# Patient Record
Sex: Male | Born: 1990 | Race: Black or African American | Hispanic: No | Marital: Married | Smoking: Current every day smoker
Health system: Southern US, Community
[De-identification: ages and names within clinical notes are randomized; demographics above are authoritative.]

## PROBLEM LIST (undated history)

## (undated) DIAGNOSIS — J45909 Unspecified asthma, uncomplicated: Secondary | ICD-10-CM

---
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
## 2014-02-28 ENCOUNTER — Emergency Department (HOSPITAL_COMMUNITY)
Admission: EM | Admit: 2014-02-28 | Discharge: 2014-02-28 | Disposition: A | Discharge status: Home / Self Care | Payer: Self-pay | Attending: Emergency Medicine | Admitting: Emergency Medicine

## 2014-02-28 ENCOUNTER — Encounter (HOSPITAL_COMMUNITY): Payer: Self-pay | Admitting: Emergency Medicine

## 2014-02-28 DIAGNOSIS — Z23 Encounter for immunization: Secondary | ICD-10-CM | POA: Insufficient documentation

## 2014-02-28 DIAGNOSIS — W268XXA Contact with other sharp object(s), not elsewhere classified, initial encounter: Secondary | ICD-10-CM | POA: Insufficient documentation

## 2014-02-28 DIAGNOSIS — S81009A Unspecified open wound, unspecified knee, initial encounter: Secondary | ICD-10-CM | POA: Insufficient documentation

## 2014-02-28 DIAGNOSIS — Y9389 Activity, other specified: Secondary | ICD-10-CM | POA: Insufficient documentation

## 2014-02-28 DIAGNOSIS — Y929 Unspecified place or not applicable: Secondary | ICD-10-CM | POA: Insufficient documentation

## 2014-02-28 DIAGNOSIS — F17200 Nicotine dependence, unspecified, uncomplicated: Secondary | ICD-10-CM | POA: Insufficient documentation

## 2014-02-28 DIAGNOSIS — S81809A Unspecified open wound, unspecified lower leg, initial encounter: Principal | ICD-10-CM

## 2014-02-28 DIAGNOSIS — S91009A Unspecified open wound, unspecified ankle, initial encounter: Principal | ICD-10-CM

## 2014-02-28 DIAGNOSIS — S81812A Laceration without foreign body, left lower leg, initial encounter: Secondary | ICD-10-CM

## 2014-02-28 MED ORDER — TETANUS-DIPHTH-ACELL PERTUSSIS 5-2.5-18.5 LF-MCG/0.5 IM SUSP
0.5000 mL | Freq: Once | INTRAMUSCULAR | Status: AC
  Administered 2014-02-28: 0.5 mL via INTRAMUSCULAR
  Filled 2014-02-28: qty 0.5

## 2014-02-28 MED ORDER — IBUPROFEN 800 MG PO TABS: 800.0000 mg | ORAL_TABLET | Freq: Three times a day (TID) | ORAL | Status: DC

## 2014-02-28 NOTE — ED Notes (Signed)
Pt reports laceration to left lower leg on glass this am, sts cleaned wound with alcohol and neosporin, bleeding controlled at this time, cut is straight clean cut. Approx. 2 inches

## 2014-02-28 NOTE — ED Provider Notes (Signed)
CSN: [id number]     Arrival date & time 02/28/14  [id number] History   First MD Initiated Contact with Patient 02/28/14 [phone]     Chief Complaint  Patient presents with  . Laceration     (Consider location/radiation/quality/duration/timing/severity/associated sxs/prior Treatment) The history is provided by the patient. No language interpreter was used.   David Spears is a 23 y.o. male [facility] with the complaint of LLE laceration .  The accident happened this morning.  The patient was taking out the trashing with some shards of glass. The bag slipped and the glass cut the medial aspect of the patient's left lower extremity.  The patient then cleaned the wound with rubbing alcoholol.   He denies any loss of function or sensation distal to the wound.  The patient does not remember the last time he had a tetanus shot.  History reviewed. No pertinent past medical history. History reviewed. No pertinent past surgical history. History reviewed. No pertinent family history. History  Substance Use Topics  . Smoking status: Current Every Day Smoker  . Smokeless tobacco: Not on file  . Alcohol Use: No    Review of Systems  Constitutional: Negative for fever and chills.  HENT: Negative.   Respiratory: Negative.   Cardiovascular: Negative.   Gastrointestinal: Negative.   Musculoskeletal: Negative.   Skin: Positive for wound (LLE).  Neurological: Negative.       Allergies  Review of patient's allergies indicates no known allergies.  Home Medications   Prior to Admission medications   Not on File   BP 136/76  Pulse 63  Temp(Src) 97.7 F (36.5 C) (Oral)  Resp 19  Ht 6\' 3"  (1.905 m)  Wt 190 lb (86.183 kg)  BMI 23.75 kg/m2  SpO2 99% Physical Exam  Constitutional: He appears well-developed and well-nourished.  HENT:  Head: Normocephalic.  Neck: Normal range of motion. Neck supple.  Cardiovascular: Normal rate and regular rhythm.   Pulmonary/Chest: Effort normal and  breath sounds normal.  Abdominal: Soft. Bowel sounds are normal. There is no tenderness. There is no rebound and no guarding.  Musculoskeletal: Normal range of motion.  Neurological: He is alert. No cranial nerve deficit.  Skin: Skin is warm and dry. Laceration (LLE 3-4cm Superficial) noted. No rash noted.     Psychiatric: He has a normal mood and affect.    ED Course  Procedures (including critical care time) Labs Review Labs Reviewed - No data to display LACERATION REPAIR Performed by: Elpidio AnisUPSTILL, Tranesha Lessner A Authorized by: Elpidio AnisUPSTILL, Tayquan Gassman A Consent: Verbal consent obtained. Risks and benefits: risks, benefits and alternatives were discussed Consent given by: patient Patient identity confirmed: provided demographic data Prepped and Draped in normal sterile fashion Wound explored  Laceration Location: left lower extremity  Laceration Length: 4 cm  No Foreign Bodies seen or palpated  Anesthesia: local infiltration  Local anesthetic: lidocaine 1% w/ epinephrine  Anesthetic total: 2 ml  Irrigation method: syringe Amount of cleaning: standard  Skin closure: staple  Number of sutures: 5  Technique: staple  Patient tolerance: Patient tolerated the procedure well with no immediate complications.  Imaging Review No results found.   EKG Interpretation None      MDM   Final diagnoses:  None   1. Left LE laceration  Uncomplicated laceration, repaired as per above. Stable for discharge.   LACERATION REPAIR Performed by: Elpidio AnisUPSTILL, Destynee Stringfellow A Authorized by: Elpidio AnisUPSTILL, Mekala Winger A Consent: Verbal consent obtained. Risks and benefits: risks, benefits and alternatives were discussed  Consent given by: patient Patient identity confirmed: provided demographic data Prepped and Draped in normal sterile fashion Wound explored  Laceration Location: LLE  Laceration Length: 3.6cm  No Foreign Bodies seen or palpated  Anesthesia: local infiltration  Local anesthetic: lidocaine 2%  with epinephrine  Anesthetic total: 5 ml  Irrigation method: syringe Amount of cleaning: standard  Skin closure with good approximation.   Number of staples: 8  Patient tolerance: Patient tolerated the procedure well with no immediate complications.  Arnoldo HookerShari A Abednego Yeates, PA-C 03/06/14 [phone]

## 2014-02-28 NOTE — Discharge Instructions (Signed)
Tetanus and Diphtheria Vaccine Your caregiver has suggested that you receive an immunization to prevent tetanus (lockjaw) and diphtheria. Tetanus and diphtheria are serious and deadly infectious diseases of the past that have been nearly wiped out by modern immunizations. Td or DT vaccines (shots) are the immunizations given to help prevent these illnesses. Td is the medical term for a standard tetanus dose, small diphtheria dose. DT means both in standard doses. ABOUT THE DISEASES Tetanus (lockjaw) and diphtheria are serious diseases. Tetanus is caused by a germ that lives in the soil. It enters the body through a cut or wound, often caused by a nail or broken piece of glass. You cannot catch tetanus from another person. Diphtheria spreads when germs pass from an infected person to the nose or throat of others. Tetanus causes serious, painful spasms of all muscles. It can lead to:  "Locking" of the muscles of the jaw and throat, so the patient cannot open his or her mouth or swallow.  Damage to the heart muscle. Diphtheria causes a thick coating in the nose, throat, or airway. It can lead to:  Breathing problems.  Kidney problems.  Heart failure.  Paralysis.  Death. ABOUT THE VACCINES  A vaccine is a shot (immunization) that can help prevent a disease. Vaccines have helped lower the rates of getting certain diseases. If people stopped getting vaccinated, more people would develop illnesses. These vaccines can be used in three ways:  As catch-up for people who did not get all their doses when they were children.  As a booster dose every 10 years.  For protection against tetanus infection, after a wound. Benefits of the vaccines Vaccination is the best way to protect against tetanus and diphtheria. Because of vaccination, there are fewer cases of these diseases. Cases are rare in children because most get a routine vaccination with DTP (Diphtheria, Tetanus, and Pertussis), DTaP  (Diphtheria, Tetanus, and acellular Pertussis), or DT (Diphtheria and Tetanus) vaccines. There would be many more cases if we stopped vaccinating people. Tetanus kills about 1 in 5 people who are infected. WHEN SHOULD YOU GET TD VACCINE?  Td is made for people 62 years of age and older.  People who have not gotten at least 3 doses of any tetanus and diphtheria vaccine (DTP, DTaP or DT) during their lifetime should do so using Td. After a person gets the third dose, a Td dose is needed every 10 years all through life. This is because protection fades over time. Booster shots are needed every 10 years.  Other vaccines may be given at the same time as Td. You may not know today whether your immunizations are current. The vaccine given today is to protect you from your next cut or injury. It does not offer protection for the current injury. An immune globulin injection may be given, if protection is needed immediately. Check with your caregiver later regarding your immunization status. Tell your caregiver if the person getting the vaccine:  Has ever had a serious allergic reaction or other problem with Td, or any other tetanus and diphtheria vaccine (DTP, DTaP, or DT). People who have had a serious allergic reaction should not receive the vaccine.  Has epilepsy or another nervous system illness.  Has had Guillain Barre Syndrome (GBS) in the past.  Now has a moderate or severe illness.  Is pregnant.  If you are not sure, ask your caregiver. WHAT ARE THE RISKS FROM TD VACCINE?  As with any medicine, there are very small  risks that serious problems, even death, could occur after getting a vaccine. However, the risk of a serious side effect from the vaccine is almost zero.  The risks from the vaccine are much smaller than the risks from the diseases, if people stopped getting vaccinated. Both diseases can cause serious health problems, which are prevented by the vaccine.  Almost all people who get  Td have no problems from it. Mild problems If mild problems occur, they usually start within hours to a day or two after vaccination. They may last 1-2 days:  Soreness, redness, or swelling where the shot was given.  Headache or tiredness.  Occasionally, a low grade fever. These problems can be worse in adults who get Td vaccine very often. Non-aspirin medicines may be used to reduce soreness. Severe problems These problems happen very rarely:  Serious allergic reaction (at most, occurs in 1 in 1 million vaccinated persons). This occurs almost immediately, and is treatable with medicines. Signs of a serious allergic reaction include:  Difficulty breathing.  Hoarseness or wheezing.  Hives.  Dizziness.  Deep, aching pain and muscle wasting in upper arm(s). Overall, the benefits to you and your family from these vaccines are far greater than the risk. WHAT TO DO IF THERE IS A SERIOUS REACTION:  Call a caregiver or get the person to a doctor or emergency room right away.  Write down what happened, the date and time it happened, and tell your caregiver.  Ask your caregiver to file a Vaccine Adverse Event Report form or call, toll-free: [phone] If you want to learn more about this vaccine, ask your caregiver. She/he can give you the vaccine package insert or suggest other sources of information. Also, the [employer] Injury Compensation Program gives compensation (payment) for persons thought to be injured by vaccines. For details call, toll-free: [phone](800) [phone]. Document Released: 08/08/2000 Document Revised: 11/03/2011 Document Reviewed: 06/28/2009 [facility] Patient Information 2015 [place], [place]. This information is not intended to replace advice given to you by your health care provider. Make sure you discuss any questions you have with your health care provider. Staple Wound Closure Staples are used to help a wound heal faster by holding the edges of the wound  together. HOME CARE  Keep the area around the staples clean and dry.  Rest and raise (elevate) the injured part above the level of your heart.  See your doctor for a follow-up check of the wound.  See your doctor to have the staples removed.  Clean the wound daily with water.  Do not soak the wound in water for long periods of time.  Let air reach the wound as it heals. GET HELP RIGHT AWAY IF:   You have redness or puffiness around the wound.  You have a red line going away from the wound.  You have more pain or tenderness.  You have yellowish-white fluid (pus) coming from the wound.  Your wound does not stay together after the staples have been taken out.  You see something coming out of the wound, such as wood or glass.  You have problems moving the injured area.  You have a fever or lasting symptoms for more than 2-3 days.  You have a fever and your symptoms suddenly get worse. MAKE SURE YOU:   Understand these instructions.  Will watch this condition.  Will get help right away if you are not doing well or get worse. Document Released: 05/20/2008 Document Revised: 05/05/2012 Document Reviewed: 02/22/2012 ExitCare Patient  Information 2015 [place], [place]. This information is not intended to replace advice given to you by your health care provider. Make sure you discuss any questions you have with your health care provider.

## 2014-03-06 NOTE — ED Provider Notes (Signed)
Medical screening examination/treatment/procedure(s) were performed by non-physician practitioner and as supervising physician I was immediately available for consultation/collaboration.     Geoffery Lyonsouglas Zlata Alcaide, MD 03/06/14 [id number]

## 2014-03-11 ENCOUNTER — Encounter (HOSPITAL_COMMUNITY): Payer: Self-pay | Admitting: Emergency Medicine

## 2014-03-11 ENCOUNTER — Emergency Department (INDEPENDENT_AMBULATORY_CARE_PROVIDER_SITE_OTHER)
Admission: EM | Admit: 2014-03-11 | Discharge: 2014-03-11 | Disposition: A | Discharge status: Home / Self Care | Payer: Self-pay | Source: Home / Self Care | Attending: Emergency Medicine | Admitting: Emergency Medicine

## 2014-03-11 DIAGNOSIS — L03119 Cellulitis of unspecified part of limb: Secondary | ICD-10-CM

## 2014-03-11 DIAGNOSIS — L03116 Cellulitis of left lower limb: Secondary | ICD-10-CM

## 2014-03-11 DIAGNOSIS — L02419 Cutaneous abscess of limb, unspecified: Secondary | ICD-10-CM

## 2014-03-11 DIAGNOSIS — Z4802 Encounter for removal of sutures: Secondary | ICD-10-CM

## 2014-03-11 MED ORDER — SULFAMETHOXAZOLE-TMP DS 800-160 MG PO TABS: 1.0000 | ORAL_TABLET | Freq: Two times a day (BID) | ORAL | Status: DC

## 2014-03-11 MED ORDER — CLINDAMYCIN HCL 300 MG PO CAPS: 300.0000 mg | ORAL_CAPSULE | Freq: Three times a day (TID) | ORAL | Status: DC

## 2014-03-11 MED ORDER — BACITRACIN 500 UNIT/GM EX OINT
1.0000 "application " | TOPICAL_OINTMENT | Freq: Once | CUTANEOUS | Status: AC
  Administered 2014-03-11: 1 via TOPICAL

## 2014-03-11 NOTE — ED Provider Notes (Signed)
CSN: [id number]     Arrival date & time 03/11/14  1839 History   First MD Initiated Contact with Patient 03/11/14 1859     Chief Complaint  Patient presents with  . Suture / Staple Removal   (Consider location/radiation/quality/duration/timing/severity/associated sxs/prior Treatment) HPI Comments: 23 year old male presents for staple removal from his left medial calf. He had these placed 11 days ago. He said the area is painful and swollen. No drainage. He denies any other systemic symptoms.   Patient is a 23 y.o. male presenting with suture removal.  Suture / Staple Removal    History reviewed. No pertinent past medical history. History reviewed. No pertinent past surgical history. No family history on file. History  Substance Use Topics  . Smoking status: Current Every Day Smoker  . Smokeless tobacco: Not on file  . Alcohol Use: No    Review of Systems  Skin: Positive for color change and wound.  All other systems reviewed and are negative.   Allergies  Review of patient's allergies indicates no known allergies.  Home Medications   Prior to Admission medications   Medication Sig Start Date End Date Taking? Authorizing Provider  ibuprofen (ADVIL,MOTRIN) 800 MG tablet Take 1 tablet (800 mg total) by mouth 3 (three) times daily. 02/28/14   Shari A Upstill, PA-C  sulfamethoxazole-trimethoprim (BACTRIM DS) 800-160 MG per tablet Take 1 tablet by mouth 2 (two) times daily. 03/11/14   Adrian BlackwaterZachary H Lemya Greenwell, PA-C   BP 146/63  Pulse 66  Temp(Src) 98.9 F (37.2 C) (Oral)  Resp 20  SpO2 98% Physical Exam  Nursing note and vitals reviewed. Constitutional: He is oriented to person, place, and time. He appears well-developed and well-nourished. No distress.  HENT:  Head: Normocephalic.  Pulmonary/Chest: Effort normal. No respiratory distress.  Neurological: He is alert and oriented to person, place, and time. Coordination normal.  Skin: Skin is warm and dry. Laceration (woundon the  left lower medial calf, healing appropriately, staples in place. There is some surrounding erythema, mild swelling, tenderness) noted. No rash noted. He is not diaphoretic.  Psychiatric: He has a normal mood and affect. Judgment normal.    ED Course  Procedures (including critical care time) Labs Review Labs Reviewed - No data to display  Imaging Review No results found.   MDM   1. Cellulitis of left lower extremity   2. Encounter for staple removal    Staples removed. Putting him on Bactrim for one week for the mild cellulitis, return if any worsening.   Meds ordered this encounter  Medications  . DISCONTD: clindamycin (CLEOCIN) 300 MG capsule    Sig: Take 1 capsule (300 mg total) by mouth 3 (three) times daily.    Dispense:  21 capsule    Refill:  0    Order Specific Question:  Supervising Provider    Answer:  Lorenz CoasterKELLER, DAVID C [id number]]  . sulfamethoxazole-trimethoprim (BACTRIM DS) 800-160 MG per tablet    Sig: Take 1 tablet by mouth 2 (two) times daily.    Dispense:  14 tablet    Refill:  0    Order Specific Question:  Supervising Provider    Answer:  Lorenz CoasterKELLER, DAVID C [id number]]  . bacitracin ointment 1 application    Sig:        Graylon GoodZachary H Darely Becknell, PA-C 03/11/14 1904

## 2014-03-11 NOTE — ED Notes (Signed)
Presents for staple removal left medial lower leg.  Staples placed 7/7; states was supposed to have them removed sooner, but "he was scared".  Now wound/staples are painful.

## 2014-03-11 NOTE — Discharge Instructions (Signed)
Cellulitis Cellulitis is an infection of the skin and the tissue beneath it. The infected area is usually red and tender. Cellulitis occurs most often in the arms and lower legs.  CAUSES  Cellulitis is caused by bacteria that enter the skin through cracks or cuts in the skin. The most common types of bacteria that cause cellulitis are Staphylococcus and Streptococcus. SYMPTOMS   Redness and warmth.  Swelling.  Tenderness or pain.  Fever. DIAGNOSIS  Your caregiver can usually determine what is wrong based on a physical exam. Blood tests may also be done. TREATMENT  Treatment usually involves taking an antibiotic medicine. HOME CARE INSTRUCTIONS   Take your antibiotics as directed. Finish them even if you start to feel better.  Keep the infected arm or leg elevated to reduce swelling.  Apply a warm cloth to the affected area up to 4 times per day to relieve pain.  Only take over-the-counter or prescription medicines for pain, discomfort, or fever as directed by your caregiver.  Keep all follow-up appointments as directed by your caregiver. SEEK MEDICAL CARE IF:   You notice red streaks coming from the infected area.  Your red area gets larger or turns dark in color.  Your bone or joint underneath the infected area becomes painful after the skin has healed.  Your infection returns in the same area or another area.  You notice a swollen bump in the infected area.  You develop new symptoms. SEEK IMMEDIATE MEDICAL CARE IF:   You have a fever.  You feel very sleepy.  You develop vomiting or diarrhea.  You have a general ill feeling (malaise) with muscle aches and pains. MAKE SURE YOU:   Understand these instructions.  Will watch your condition.  Will get help right away if you are not doing well or get worse. Document Released: 05/21/2005 Document Revised: 02/10/2012 Document Reviewed: 10/27/2011 [facility] Patient Information 2015 [place], [place]. This information is  not intended to replace advice given to you by your health care provider. Make sure you discuss any questions you have with your health care provider.  Staple Removal, Care After The staples that were used to close your skin have been removed. The care described here will need to continue until the wound is completely healed and your health care provider confirms that wound care can be stopped. HOME CARE INSTRUCTIONS   Keep the wound site dry and clean. Do not soak it in water.  If skin adhesive strips were applied after the staples were removed, they will begin to peel off in a few days. Allow them to remain in place until they fall off on their own.  If you still have a bandage (dressing), change it at least once a day or as directed by your health care provider. If the dressing sticks, pour warm, sterile water over it until it loosens and can be removed without pulling apart the wound edges. Pat dry with a clean towel.  Apply cream or ointment that stops the growth of bacteria (antibacterial cream or ointment) only if your health care provider has directed you to do so. Place a nonstick bandage over the wound to prevent the dressing from sticking.  Cover the nonstick bandage with a new dressing as directed by your health care provider.  If the bandage becomes wet, dirty, or develops a bad smell, change it as soon as possible.  New scars become sunburned easily. Use sunscreens with a sun protection factor (SPF) of at least 15 when out  in the sun. Reapply the SPF every 2 hours.  Only take medicines as directed by your health care provider. SEEK IMMEDIATE MEDICAL CARE IF:   You have redness, swelling, or increasing pain in the wound.  You have pus coming from the wound.  You have a fever.  You notice a bad smell coming from the wound or dressing.  Your wound edges open up after staples have been removed. MAKE SURE YOU:   Understand these instructions.  Will watch your  condition.  Will get help right away if you are not doing well or get worse. Document Released: 07/24/2008 Document Revised: 08/16/2013 Document Reviewed: 07/24/2008 [facility] Patient Information 2015 [place], [place]. This information is not intended to replace advice given to you by your health care provider. Make sure you discuss any questions you have with your health care provider.

## 2014-03-12 ENCOUNTER — Encounter (HOSPITAL_COMMUNITY): Payer: Self-pay | Admitting: Emergency Medicine

## 2014-03-12 ENCOUNTER — Emergency Department (HOSPITAL_COMMUNITY): Payer: Self-pay

## 2014-03-12 ENCOUNTER — Emergency Department (HOSPITAL_COMMUNITY)
Admission: EM | Admit: 2014-03-12 | Discharge: 2014-03-12 | Disposition: A | Discharge status: Home / Self Care | Payer: Self-pay | Attending: Emergency Medicine | Admitting: Emergency Medicine

## 2014-03-12 DIAGNOSIS — R0789 Other chest pain: Secondary | ICD-10-CM | POA: Insufficient documentation

## 2014-03-12 DIAGNOSIS — K2920 Alcoholic gastritis without bleeding: Secondary | ICD-10-CM | POA: Insufficient documentation

## 2014-03-12 DIAGNOSIS — Z791 Long term (current) use of non-steroidal anti-inflammatories (NSAID): Secondary | ICD-10-CM | POA: Insufficient documentation

## 2014-03-12 DIAGNOSIS — R45851 Suicidal ideations: Secondary | ICD-10-CM | POA: Insufficient documentation

## 2014-03-12 DIAGNOSIS — Z79899 Other long term (current) drug therapy: Secondary | ICD-10-CM | POA: Insufficient documentation

## 2014-03-12 DIAGNOSIS — R51 Headache: Secondary | ICD-10-CM | POA: Insufficient documentation

## 2014-03-12 DIAGNOSIS — F17200 Nicotine dependence, unspecified, uncomplicated: Secondary | ICD-10-CM | POA: Insufficient documentation

## 2014-03-12 DIAGNOSIS — Z8659 Personal history of other mental and behavioral disorders: Secondary | ICD-10-CM | POA: Insufficient documentation

## 2014-03-12 LAB — COMPREHENSIVE METABOLIC PANEL
ALK PHOS: 59 U/L (ref 39–117)
ALT: 13 U/L (ref 0–53)
AST: 15 U/L (ref 0–37)
Albumin: 4 g/dL (ref 3.5–5.2)
Anion gap: 14 (ref 5–15)
BILIRUBIN TOTAL: 0.7 mg/dL (ref 0.3–1.2)
BUN: 7 mg/dL (ref 6–23)
CHLORIDE: 102 meq/L (ref 96–112)
CO2: 25 mEq/L (ref 19–32)
Calcium: 9.1 mg/dL (ref 8.4–10.5)
Creatinine, Ser: 0.72 mg/dL (ref 0.50–1.35)
GFR calc non Af Amer: >90 mL/min (ref >90)
GLUCOSE: 88 mg/dL (ref 70–99)
POTASSIUM: 4.2 meq/L (ref 3.7–5.3)
SODIUM: 141 meq/L (ref 137–147)
TOTAL PROTEIN: 6.9 g/dL (ref 6.0–8.3)

## 2014-03-12 LAB — CBC WITH DIFFERENTIAL/PLATELET
BASOS PCT: 0 % (ref 0–1)
Basophils Absolute: 0 10*3/uL (ref 0.0–0.1)
EOS ABS: 0.1 10*3/uL (ref 0.0–0.7)
Eosinophils Relative: 1 % (ref 0–5)
HEMATOCRIT: 41.7 % (ref 39.0–52.0)
HEMOGLOBIN: 13.8 g/dL (ref 13.0–17.0)
Lymphocytes Relative: 21 % (ref 12–46)
Lymphs Abs: 1.4 10*3/uL (ref 0.7–4.0)
MCH: 26.7 pg (ref 26.0–34.0)
MCHC: 33.1 g/dL (ref 30.0–36.0)
MCV: 80.8 fL (ref 78.0–100.0)
MONO ABS: 0.7 10*3/uL (ref 0.1–1.0)
MONOS PCT: 11 % (ref 3–12)
NEUTROS PCT: 67 % (ref 43–77)
Neutro Abs: 4.5 10*3/uL (ref 1.7–7.7)
Platelets: 190 10*3/uL (ref 150–400)
RBC: 5.16 MIL/uL (ref 4.22–5.81)
RDW: 13.6 % (ref 11.5–15.5)
WBC: 6.7 10*3/uL (ref 4.0–10.5)

## 2014-03-12 LAB — ETHANOL

## 2014-03-12 LAB — ACETAMINOPHEN LEVEL

## 2014-03-12 LAB — RAPID URINE DRUG SCREEN, HOSP PERFORMED
AMPHETAMINES: NOT DETECTED
Barbiturates: NOT DETECTED
Benzodiazepines: NOT DETECTED
Cocaine: NOT DETECTED
OPIATES: NOT DETECTED
Tetrahydrocannabinol: POSITIVE — AB

## 2014-03-12 LAB — I-STAT TROPONIN, ED: Troponin i, poc: 0 ng/mL (ref 0.00–0.08)

## 2014-03-12 LAB — LIPASE, BLOOD: Lipase: 20 U/L (ref 11–59)

## 2014-03-12 LAB — SALICYLATE LEVEL

## 2014-03-12 MED ORDER — GI COCKTAIL ~~LOC~~
30.0000 mL | Freq: Once | ORAL | Status: AC
  Administered 2014-03-12: 30 mL via ORAL
  Filled 2014-03-12: qty 30

## 2014-03-12 MED ORDER — ONDANSETRON HCL 4 MG/2ML IJ SOLN
4.0000 mg | Freq: Once | INTRAMUSCULAR | Status: AC
Start: 2014-03-12 — End: 2014-03-12
  Administered 2014-03-12: 4 mg via INTRAVENOUS
  Filled 2014-03-12: qty 2

## 2014-03-12 MED ORDER — SODIUM CHLORIDE 0.9 % IV BOLUS (SEPSIS)
1000.0000 mL | Freq: Once | INTRAVENOUS | Status: AC
  Administered 2014-03-12: 1000 mL via INTRAVENOUS

## 2014-03-12 NOTE — ED Provider Notes (Signed)
Medical screening examination/treatment/procedure(s) were performed by non-physician practitioner and as supervising physician I was immediately available for consultation/collaboration.  Amory Simonetti, M.D.  Dakari Cregger C Mariusz Jubb, MD 03/12/14 0909 

## 2014-03-12 NOTE — Discharge Instructions (Signed)
[facility] for further evaluation and management of your psychiatric disorder. Do not drink alcohol while taking Bactrim (sulfamethoxazole/trimethoprim) the antibiotic recently prescribed for you.  Avoid consuming alcohol in excess. Call for a follow up appointment with a Family or Primary Care Provider.  Return if Symptoms worsen.   Take medication as prescribed.  Drink plenty of water.  Emergency Department Resource Guide 1) Find a Doctor and Pay Out of Pocket Although you won't have to find out who is covered by your insurance plan, it is a good idea to ask around and get recommendations. You will then need to call the office and see if the doctor you have chosen will accept you as a new patient and what types of options they offer for patients who are self-pay. Some doctors offer discounts or will set up payment plans for their patients who do not have insurance, but you will need to ask so you aren't surprised when you get to your appointment.  2) Contact Your Local Health Department Not all health departments have doctors that can see patients for sick visits, but many do, so it is worth a call to see if yours does. If you don't know where your local health department is, you can check in your phone book. The CDC also has a tool to help you locate your state's health department, and many state websites also have listings of all of their local health departments.  3) Find a Walk-in Clinic If your illness is not likely to be very severe or complicated, you may want to try a walk in clinic. These are popping up all over the country in pharmacies, drugstores, and shopping centers. They're usually staffed by nurse practitioners or physician assistants that have been trained to treat common illnesses and complaints. They're usually fairly quick and inexpensive. However, if you have serious medical issues or chronic medical problems, these are probably not your best  option.  No Primary Care Doctor: - Call Health Connect at  [phone] - they can help you locate a primary care doctor that  accepts your insurance, provides certain services, etc. - Physician Referral Service- [phone]  Chronic Pain Problems: Organization         Address  Phone   Notes  [facility]  [phone] Patients need to be referred by their primary care doctor.   Medication Assistance: Organization         Address  Phone   Notes  [facility] [address] [place], [place] [address] [phone]) [phone] --Must be a resident of [facility] -- Must have NO insurance coverage whatsoever (no Medicaid/ Medicare, etc.) -- The pt. MUST have a primary care doctor that directs their care regularly and follows them in the community   MedAssist  [phone]) [phone]   [employer]  [phone]    Agencies that provide inexpensive medical care: Organization         Address  Phone   Notes  [facility]  [phone]) [phone]   [facility]    [phone]) [phone]   [facility] [address] [place], [place] [address] [phone]   [facility] [address], [place] [phone]   [facility]anned [facility]    [phone]   [facility]    [phone]   [facility]  [address], [place]  Phone:  [phone], Fax:  [phone] Hours of Operation:  9 am - 6 pm, M-F.  Also accepts Medicaid/Medicare and self-pay.  [facility] for [place], [address], [place] Phone: [phone], Fax: [phone]. Hours of Operation:  8:30 am - 5:30 pm, M-F.  Also accepts Medicaid and self-pay.  [facility] [place] [address] [place] Phone: [phone]   [place], [place], [place] [phone], Ext. [phone] Mondays & Thursdays: 7-9 AM.  First 15  patients are seen on a first come, first serve basis.    [place] Providers:  Organization         Address  Phone   Notes  [facility] [address], [place] [phone] Also accepts self-pay patients.  [facility] [address], [place]  [phone]   [place], [address], [place] [phone]   [facility] [address], [place] [phone]   Lucianne Lei [address], [place]   [phone] Only accepts [place] Access [place] patients after they have their name applied to their card.   Self-Pay (no insurance) in [facility]:  Organization         Address  Phone   Notes  Sickle Cell Patients, [facility] [place] [phone]   [facility] [place] [phone]   [facility] [place]  [place], [place], [place] [phone]   [facility]/Dr. Osei-Bonsu  [address], [place] or [address], [place] [phone] Phone number for both [place] and [place] locations is the same.  [facility] [address], [place] [phone]   [facility] [address], [place] or [address] [phone]   [facility] [address] [place] [phone], phone; [phone], fax Sees patients 1st and 3rd Saturday of every month.  Must not qualify for public or private insurance (i.e. Medicaid, Medicare, [place] Health Choice, Veterans' Benefits)  Household income should be no more than 200% of the poverty level The clinic cannot treat you if you are pregnant or think you are pregnant  Sexually transmitted diseases are not treated at the clinic.    Dental  Care: Organization         Address  Phone  Notes  [facility] [place] [phone] Accepts children up to age 37 who are enrolled in [place] or [place]; pregnant women with a Medicaid card; and children who have applied for Medicaid or [place] Health Choice, but were declined, whose parents can pay a reduced fee at time of service.  [facility] Department of [facility]  [address], [place] [phone] Accepts children up to age 70 who are enrolled in [place] or [place]; pregnant women with a Medicaid card; and children who have applied for Medicaid or [place] Health Choice, but were declined, whose parents can pay a reduced fee at time of service.  [facility]  [place] [phone] Patients are seen by appointment  only. Walk-ins are not accepted. [place] will see patients 27 years of age and older. Monday - Tuesday (8am-5pm) Most Wednesdays (8:30-5pm) $30 per visit, cash only  [facility] Access [facility]  [address], [facility] [phone] Patients are seen by appointment only. Walk-ins are not accepted. [place] will see patients 78 years of age and older. One Wednesday Evening (Monthly: Volunteer Based).  $30 per visit, cash only  [place]  [phone] for adults; Children under age 8, call Graduate Pediatric Dentistry at [phone]. Children aged 27-14, please call [phone] to request a pediatric application.  Dental services are provided in all areas of dental care including fillings, crowns and bridges, complete and partial dentures, implants, gum treatment, root canals, and extractions. Preventive care is also provided. Treatment is provided to both adults and children. Patients are selected via a lottery and there is often a waiting list.   [facility] [address], [place]  [phone] [email or web address]   [facility] [address] [place], [place] [phone], Ext. [phone] Second and Fourth Thursday of each month, opens at 6:30 AM; Clinic ends at 9 AM.  Patients are seen on a first-come first-served basis, and a limited number are seen during each clinic.   [facility]  [address] Hillard Danker [place], [place] [phone]   Eligibility Requirements You must have lived in [place], [place], or [place] counties for at least the last three months.   You cannot be eligible for state or federal sponsored [employer], including [employer], [place], or [employer].   You generally cannot be eligible for healthcare insurance through your employer.    How to apply: Eligibility screenings are held every Tuesday and Wednesday afternoon from 1:00 pm until 4:00 pm. You do not need an appointment for the interview!  [facility] [address] [place], [place]   [place]  [place] Department  [place]  [phone]    Behavioral Health Resources in the Community: Intensive Outpatient Programs Organization         Address  Phone  Notes  [place]. [address], [place], [place] [phone]   [facility] Outpatient [address] [place], [place]   ADS: Alcohol [facility] Drug S[facility] [address] [place], [place]   [place] [address]  [place], [place] or [phone]   Substance Abuse Resources Organization         Address  Phone  Notes  Alcohol and Drug Services  [phone]   [place]  [phone]   [facility] [place]   Chinita Pester  [phone]   Residential & Outpatient Substance Abuse Program  [phone]    Psychological Services Organization         Address  Phone  Notes  [facility] [place]  [place]  [phone]   [place] [address] [place] [phone] or [phone]    Mobile Crisis Teams Organization         Address  Phone  Notes  [facility], Mobile Crisis Care Unit  [phone]   [facility]  [address] [place], [place]   [facility] [address] [place] [phone]  Self-Help/Support Groups Organization         Address  Phone             Notes  [facility]ental [facility]oc[facility] of [place] - variety of support groups  [phone]- [phone] Call for more information  Narcotics Anonymous (NA), [facility] [address], [employer] [place]  2 meetings at this location   Statistician         Address  Phone  Notes  [facility] [address],    [place]  [phone]   [facility]  [address], [place] [address], [place], [place] [phone]   [facility] Treatment Facility [address] [place], [place] [phone] Admissions: 8am-3pm M-F  [facility] [address]    [place], [place] [phone]   [facility] [address] [place], [place], [place] [phone]   [facility] [address],  [place], [place] [phone]   [facility] - Intensive Outpatient [address], [place], [place] [phone]   [facility] (Addiction Recovery Care [facility] [address]  [place], [place] [phone] or [phone]   [facility] Services (RTS) [address], [place], [place] [phone] Accepts Medicaid  [facility] [address],  [place] [phone] Substance Abuse/Addiction Treatment   [facility] Organization         Address  Phone  Notes  [facility]  [phone]   Angie Fava, PhD [address] Ervin Knack [place], [place]   [phone] or [phone]   [facility] Behavioral   [address] [place], [place] [phone]   [facility] [address] [place], [place] [phone] Insurance/Medicaid/sponsorship through [facility] and Families [address]                                    [place], [place] [phone] Therapy/tele-psych/case  [facility] [address], [place] [phone]    Dr. Lolly Mustache  [phone]   [facility]  [employer] [facility] Dept. 1) [address] [place] 2) [address], [place] 3)  [address], [place] [phone]  [phone]   [facility] Child Abuse Hotline 934-539-2875 or [phone] (After Hours)

## 2014-03-12 NOTE — ED Notes (Signed)
Called AC. [facility] sitter until after 1500 [facility]

## 2014-03-12 NOTE — ED Notes (Addendum)
Pt c/o center chest pain, abdominal pain and headache onset last night while drinking. Pt reports vomiting yellow secretions. Pt also reports suicidal ideation.

## 2014-03-12 NOTE — ED Provider Notes (Signed)
CSN: [id number]     Arrival date & time 03/12/14  1349 History   First MD Initiated Contact with Patient 03/12/14 1456     Chief Complaint  Patient presents with  . Abdominal Pain  . Headache  . Chest Pain  . Suicidal     (Consider location/radiation/quality/duration/timing/severity/associated sxs/prior Treatment) HPI Comments: The patient is a 23 year old male past history of bipolar, schizophrenia presents emergency room chief complaint of abdominal pain and chest discomfort since last night. The patient reports drinking multiple liquor drinks last night including "fruit punch ", white liquor. He reports he passed out on the ground outside due to intoxication. He reports multiple episodes of nonbloody emesis. Family remember reports bilious emesis, denies blood. He reports epigastric pain stated as sharp.  He also complains of chest pain, central, non-radiating, self resolved while waiting in room. He reports associated shortness of breath. Chest pain is not aggravated by movement, deep inspiration, exertion. The patient reports he had a positive screening test when asked about thoughts of suicide, He reports thoughts of "not wanting to be here', when thinking about his cousin who recently passed away. He denies specific plan. He does not feel that he is a harm to self or others. No HI. He reports alcohol use, denies cocaine, amphetamines, IV drug use. He reports history of visual and auditory hallucinations while in jail approximately 4 years ago, release November 2014.  He reports his taken medication the past last medication approximately 1.5 months ago. He is close to [facility] for further evaluation of his behavior health and medication adjustemnt.   Patient is a 23 y.o. male presenting with abdominal pain, headaches, and chest pain. The history is provided by the patient and a relative. No language interpreter was used.  Abdominal Pain Associated symptoms: chest pain,  nausea, shortness of breath and vomiting   Associated symptoms: no chills, no constipation, no cough, no diarrhea and no fever   Headache Associated symptoms: abdominal pain, nausea and vomiting   Associated symptoms: no cough, no diarrhea and no fever   Chest Pain Associated symptoms: abdominal pain, headache, nausea, shortness of breath and vomiting   Associated symptoms: no cough, no fever and no palpitations     History reviewed. No pertinent past medical history. History reviewed. No pertinent past surgical history. No family history on file. History  Substance Use Topics  . Smoking status: Current Every Day Smoker  . Smokeless tobacco: Not on file  . Alcohol Use: No    Review of Systems  Constitutional: Negative for fever and chills.  Respiratory: Positive for shortness of breath. Negative for cough.   Cardiovascular: Positive for chest pain. Negative for palpitations and leg swelling.  Gastrointestinal: Positive for nausea, vomiting and abdominal pain. Negative for diarrhea and constipation.  Neurological: Positive for headaches.  Psychiatric/Behavioral: Negative for suicidal ideas, hallucinations and self-injury.      Allergies  Review of patient's allergies indicates no known allergies.  Home Medications   Prior to Admission medications   Medication Sig Start Date End Date Taking? Authorizing Provider  ibuprofen (ADVIL,MOTRIN) 800 MG tablet Take 1 tablet (800 mg total) by mouth 3 (three) times daily. 02/28/14   Shari A Upstill, PA-C  sulfamethoxazole-trimethoprim (BACTRIM DS) 800-160 MG per tablet Take 1 tablet by mouth 2 (two) times daily. 03/11/14   Adrian BlackwaterZachary H Baker, PA-C   BP 109/57  Pulse 51  Temp(Src) 97.8 F (36.6 C) (Oral)  Resp 20  Ht 6\' 3"  (1.905 m)  Wt 190 lb (86.183 kg)  BMI 23.75 kg/m2  SpO2 100% Physical Exam  Nursing note and vitals reviewed. Constitutional: He is oriented to person, place, and time. He appears well-developed and well-nourished.   Non-toxic appearance. He does not have a sickly appearance. He does not appear ill. No distress.  HENT:  Head: Normocephalic and atraumatic.  Mouth/Throat: Oropharynx is clear and moist.  Eyes: EOM are normal. Pupils are equal, round, and reactive to light. No scleral icterus.  Neck: Normal range of motion. Neck supple.  Cardiovascular: Regular rhythm.  Bradycardia present.   Pulses:      Radial pulses are 2+ on the right side, and 2+ on the left side.  No lower extremity edema.  Pulmonary/Chest: Effort normal and breath sounds normal. No respiratory distress. He has no wheezes. He has no rales.  Abdominal: Soft. He exhibits no distension. There is tenderness. There is no rebound, no guarding and no CVA tenderness.  Musculoskeletal: Normal range of motion.  Neurological: He is alert and oriented to person, place, and time.  Skin: Skin is warm and dry. He is not diaphoretic.  Psychiatric: He has a normal mood and affect. His behavior is normal.    ED Course  Procedures (including critical care time) Labs Review Results for orders placed during the hospital encounter of 03/12/14  ACETAMINOPHEN LEVEL      Result Value Ref Range   Acetaminophen (Tylenol), Serum <15.0  10 - 30 ug/mL  ETHANOL      Result Value Ref Range   Alcohol, Ethyl (B) <11  0 - 11 mg/dL  SALICYLATE LEVEL      Result Value Ref Range   Salicylate Lvl <2.0 (*) 2.8 - 20.0 mg/dL  URINE RAPID DRUG SCREEN (HOSP PERFORMED)      Result Value Ref Range   Opiates NONE DETECTED  NONE DETECTED   Cocaine NONE DETECTED  NONE DETECTED   Benzodiazepines NONE DETECTED  NONE DETECTED   Amphetamines NONE DETECTED  NONE DETECTED   Tetrahydrocannabinol POSITIVE (*) NONE DETECTED   Barbiturates NONE DETECTED  NONE DETECTED  COMPREHENSIVE METABOLIC PANEL      Result Value Ref Range   Sodium 141  137 - 147 mEq/L   Potassium 4.2  3.7 - 5.3 mEq/L   Chloride 102  96 - 112 mEq/L   CO2 25  19 - 32 mEq/L   Glucose, Bld 88  70 - 99 mg/dL    BUN 7  6 - 23 mg/dL   Creatinine, Ser 1.61  0.50 - 1.35 mg/dL   Calcium 9.1  8.4 - 09.6 mg/dL   Total Protein 6.9  6.0 - 8.3 g/dL   Albumin 4.0  3.5 - 5.2 g/dL   AST 15  0 - 37 U/L   ALT 13  0 - 53 U/L   Alkaline Phosphatase 59  39 - 117 U/L   Total Bilirubin 0.7  0.3 - 1.2 mg/dL   GFR calc non Af Amer >90  >90 mL/min   GFR calc Af Amer >90  >90 mL/min   Anion gap 14  5 - 15  CBC WITH DIFFERENTIAL      Result Value Ref Range   WBC 6.7  4.0 - 10.5 K/uL   RBC 5.16  4.22 - 5.81 MIL/uL   Hemoglobin 13.8  13.0 - 17.0 g/dL   HCT 04.5  40.9 - 81.1 %   MCV 80.8  78.0 - 100.0 fL   MCH 26.7  26.0 - 34.0 pg  MCHC 33.1  30.0 - 36.0 g/dL   RDW 45.4  09.8 - 11.9 %   Platelets 190  150 - 400 K/uL   Neutrophils Relative % 67  43 - 77 %   Neutro Abs 4.5  1.7 - 7.7 K/uL   Lymphocytes Relative 21  12 - 46 %   Lymphs Abs 1.4  0.7 - 4.0 K/uL   Monocytes Relative 11  3 - 12 %   Monocytes Absolute 0.7  0.1 - 1.0 K/uL   Eosinophils Relative 1  0 - 5 %   Eosinophils Absolute 0.1  0.0 - 0.7 K/uL   Basophils Relative 0  0 - 1 %   Basophils Absolute 0.0  0.0 - 0.1 K/uL  LIPASE, BLOOD      Result Value Ref Range   Lipase 20  11 - 59 U/L  I-STAT TROPOININ, ED      Result Value Ref Range   Troponin i, poc 0.00  0.00 - 0.08 ng/mL   Comment 3            Dg Chest 2 View  03/12/2014   CLINICAL DATA:  Chest pain below the sternum.  EXAM: CHEST  2 VIEW  COMPARISON:  None.  FINDINGS: The heart size and mediastinal contours are within normal limits. Both lungs are clear. The visualized skeletal structures are unremarkable.  IMPRESSION: Normal chest radiographs.   Electronically Signed   By: Amie Portland M.D.   On: 03/12/2014 15:39    Date: 03/22/2014  Rate: 51   Rhythm: sinus bradycardia  QRS Axis: normal  Intervals: normal  ST/T Wave abnormalities: normal  Conduction Disutrbances:none  Narrative Interpretation:   Old EKG Reviewed: None available    MDM   Final diagnoses:  Alcoholic gastritis   History of psychiatric disorder   Patient presents with likely alcohol-induced gastritis. Also complains of chest pain, chest pain-free in ED, likely esophageal. EKG, troponin, x-ray and other labs ordered GI cocktail given. Patient reports full resolution of symptoms. He denies SI or HI, does not feel that he hard to self or others, denies auditory or visual hallucinations recently. And contracts for his safety at this time his has a followup with [employer]. Dr. Elesa Massed also evaluated the patient during this encounter. Discussed lab results, imaging results, and treatment plan with the patient. Advised pt to follow [facility], avoid abusing EtOH. Return precautions given. Reports understanding and no other concerns at this time.  Patient is stable for discharge at this time.  Meds given in ED:  Medications  ondansetron (ZOFRAN) injection 4 mg (4 mg Intravenous Given 03/12/14 1609)  sodium chloride 0.9 % bolus 1,000 mL (0 mLs Intravenous Stopped 03/12/14 1803)  gi cocktail (Maalox,Lidocaine,Donnatal) (30 mLs Oral Given 03/12/14 1609)    New Prescriptions   No medications on file     Clabe Seal, PA-C 03/13/14 0215

## 2014-03-12 NOTE — ED Provider Notes (Signed)
I saw and evaluated the patient, reviewed the resident's note and I agree with the findings and plan.   EKG Interpretation None      Pt is a 23 y.o. M with history of schizophrenia and bipolar disorder who presents emergency department with chest pain, headache and abdominal pain that started after drinking. Abdominal exam is completely benign. Labs including troponin, chest x-ray negative. He is asymptomatic the time I evaluate him and tolerating by mouth. He did express possible suicidal thoughts to a nurse in triage but adamantly denies them to myself and the PA.  He is here with his wife. He can contract for safety. [facility].  Patient and wife feel comfortable going home. His wife does not have any concerns about his psychiatric disease. I feel he is stable for discharge.  Layla MawKristen N Ward, DO 03/12/14 [phone]

## 2017-07-20 ENCOUNTER — Ambulatory Visit (HOSPITAL_COMMUNITY)
Admission: EM | Admit: 2017-07-20 | Discharge: 2017-07-20 | Disposition: A | Discharge status: Home / Self Care | Payer: Medicaid Other | Attending: Emergency Medicine | Admitting: Emergency Medicine

## 2017-07-20 ENCOUNTER — Encounter (HOSPITAL_COMMUNITY): Payer: Self-pay | Admitting: Emergency Medicine

## 2017-07-20 ENCOUNTER — Ambulatory Visit (INDEPENDENT_AMBULATORY_CARE_PROVIDER_SITE_OTHER): Payer: Medicaid Other

## 2017-07-20 DIAGNOSIS — R42 Dizziness and giddiness: Secondary | ICD-10-CM | POA: Diagnosis not present

## 2017-07-20 DIAGNOSIS — K5901 Slow transit constipation: Secondary | ICD-10-CM | POA: Diagnosis not present

## 2017-07-20 DIAGNOSIS — R1033 Periumbilical pain: Secondary | ICD-10-CM

## 2017-07-20 DIAGNOSIS — H8390 Unspecified disease of inner ear, unspecified ear: Secondary | ICD-10-CM

## 2017-07-20 MED ORDER — MECLIZINE HCL 25 MG PO TABS: 25.0000 mg | ORAL_TABLET | Freq: Three times a day (TID) | ORAL | 0 refills | Status: DC | PRN

## 2017-07-20 NOTE — ED Triage Notes (Signed)
PT reports he has been taking an old antibiotic that "starts with C"

## 2017-07-20 NOTE — ED Triage Notes (Signed)
PT reports dizziness with position changes for 4 days. PT reports LUQ abdominal pain with pain that extends into left chest. PT reports feeling febrile and lack of appetite.

## 2017-07-20 NOTE — ED Provider Notes (Signed)
[facility]    CSN: [id number] Arrival date & time: 07/20/17  1422     History   Chief Complaint Chief Complaint  Patient presents with  . Dizziness  . Abdominal Pain    HPI David Spears is a 26 y.o. male.   26 year old male has 2 primary complaints. First complaint is that of dizziness for the past 4-5 days. Dizziness is exacerbated by movement of the head and change in body position. He states that it is better with lying down on his side. On the first 2 days he did have vomiting once. Occasionally he believes he may have had some shortness of breath and chest pain often known however not currently. He has been sweating at times associated with the dizziness but no known fevers.  Second complaint is that of abdominal pain which is primarily periumbilical and radiating into the left upper quadrant. He states he has not been having normal bowel movements and no BM for the last 2-3 days.      History reviewed. No pertinent past medical history.  There are no active problems to display for this patient.   History reviewed. No pertinent surgical history.     Home Medications    Prior to Admission medications   Medication Sig Start Date End Date Taking? Authorizing Provider  meclizine (ANTIVERT) 25 MG tablet Take 1 tablet (25 mg total) by mouth 3 (three) times daily as needed for dizziness. 07/20/17   Hayden RasmussenMabe, Iliza Blankenbeckler, NP    Family History No family history on file.  Social History Social History   Tobacco Use  . Smoking status: Current Every Day Smoker  . Smokeless tobacco: Never Used  Substance Use Topics  . Alcohol use: No  . Drug use: Yes    Types: Marijuana     Allergies   Patient has no known allergies.   Review of Systems Review of Systems  Constitutional: Positive for activity change. Negative for fever.  HENT: Negative.   Eyes: Negative.   Respiratory:       As per history of present illness  Gastrointestinal: Positive for  abdominal pain and constipation.  Neurological: Positive for dizziness.  All other systems reviewed and are negative.    Physical Exam Triage Vital Signs ED Triage Vitals  Enc Vitals Group     BP 07/20/17 1445 129/77     Pulse Rate 07/20/17 1445 (!) 55     Resp 07/20/17 1445 16     Temp 07/20/17 1445 98 F (36.7 C)     Temp Source 07/20/17 1445 Oral     SpO2 07/20/17 1445 100 %     Weight 07/20/17 1443 175 lb (79.4 kg)     Height 07/20/17 1443 6\' 2"  (1.88 m)     Head Circumference --      Peak Flow --      Pain Score 07/20/17 1443 7     Pain Loc --      Pain Edu? --      Excl. in GC? --    Orthostatic VS for the past 24 hrs:  BP- Lying Pulse- Lying BP- Sitting Pulse- Sitting BP- Standing at 0 minutes Pulse- Standing at 0 minutes  07/20/17 1536 119/76 51 123/79 57 115/71 65    Updated Vital Signs BP 129/77   Pulse (!) 55   Temp 98 F (36.7 C) (Oral)   Resp 16   Ht 6\' 2"  (1.88 m)   Wt 175 lb (79.4 kg)  SpO2 100%   BMI 22.47 kg/m   Visual Acuity Right Eye Distance:   Left Eye Distance:   Bilateral Distance:    Right Eye Near:   Left Eye Near:    Bilateral Near:     Physical Exam  Constitutional: He is oriented to person, place, and time. He appears well-developed and well-nourished.  Non-toxic appearance. He does not appear ill.  Eyes: EOM are normal.  Cardiovascular: Normal rate, regular rhythm, normal heart sounds and intact distal pulses.  Pulmonary/Chest: Effort normal and breath sounds normal. No respiratory distress. He has no wheezes. He has no rales.  Abdominal: Normal appearance and bowel sounds are normal. There is no rebound, no guarding and no tenderness at McBurney's point.  Abdomen flat. Mild periumbilical tenderness, mild epigastric and left upper quadrant tenderness. No rebound or guarding.  Neurological: He is alert and oriented to person, place, and time.  Skin: Skin is warm and dry.  Psychiatric: He has a normal mood and affect. His  behavior is normal.  Nursing note and vitals reviewed.    UC Treatments / Results  Labs (all labs ordered are listed, but only abnormal results are displayed) Labs Reviewed - No data to display  EKG  EKG Interpretation None       Radiology Dg Abd 1 View  Result Date: 07/20/2017 CLINICAL DATA:  Acute left upper quadrant abdominal pain. EXAM: ABDOMEN - 1 VIEW COMPARISON:  None. FINDINGS: The bowel gas pattern is normal. Phleboliths are noted in the pelvis. Mild amount of stool is noted in the rectum. IMPRESSION: No evidence of bowel obstruction or ileus. Electronically Signed   By: Lupita RaiderJames  Green Jr, M.D.   On: 07/20/2017 15:26    Procedures Procedures (including critical care time)  Medications Ordered in UC Medications - No data to display   Initial Impression / Assessment and Plan / UC Course  I have reviewed the triage vital signs and the nursing notes.  Pertinent labs & imaging results that were available during my care of the patient were reviewed by me and considered in my medical decision making (see chart for details).    Take MiraLAX as directed for constipation. May need to drink 3 glasses and an hour and a half and repeat in 4-6 hours if not having good bowel movement. Your dizziness is likely due to an inner ear disorder. A prescription for dizziness as prescribed. The best treatment is to remain in the most comfortable position and limited movement of the head and body position as much as possible until this passes.     Final Clinical Impressions(s) / UC Diagnoses   Final diagnoses:  Slow transit constipation  Periumbilical abdominal pain  Dizziness  Disorder of inner ear, unspecified laterality    ED Discharge Orders        Ordered    meclizine (ANTIVERT) 25 MG tablet  3 times daily PRN     07/20/17 1543       Controlled Substance Prescriptions [place] Controlled Substance Registry consulted? Not Applicable   Hayden RasmussenMabe, Effie Wahlert, NP 07/20/17 1544

## 2017-07-20 NOTE — Discharge Instructions (Signed)
Take MiraLAX as directed for constipation. May need to drink 3 glasses and an hour and a half and repeat in 4-6 hours if not having good bowel movement. Your dizziness is likely due to an inner ear disorder. A prescription for dizziness as prescribed. The best treatment is to remain in the most comfortable position and limited movement of the head and body position as much as possible until this passes.

## 2018-09-22 ENCOUNTER — Emergency Department
Admission: EM | Admit: 2018-09-22 | Discharge: 2018-09-22 | Disposition: A | Discharge status: Home / Self Care | Payer: Medicaid Other | Attending: Emergency Medicine | Admitting: Emergency Medicine

## 2018-09-22 ENCOUNTER — Other Ambulatory Visit: Payer: Self-pay

## 2018-09-22 ENCOUNTER — Encounter: Payer: Self-pay | Admitting: Emergency Medicine

## 2018-09-22 DIAGNOSIS — J101 Influenza due to other identified influenza virus with other respiratory manifestations: Secondary | ICD-10-CM | POA: Insufficient documentation

## 2018-09-22 DIAGNOSIS — F17210 Nicotine dependence, cigarettes, uncomplicated: Secondary | ICD-10-CM | POA: Diagnosis not present

## 2018-09-22 DIAGNOSIS — R509 Fever, unspecified: Secondary | ICD-10-CM | POA: Diagnosis present

## 2018-09-22 LAB — INFLUENZA PANEL BY PCR (TYPE A & B)
INFLBPCR: POSITIVE — AB
Influenza A By PCR: NEGATIVE
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
## 2018-09-22 NOTE — ED Triage Notes (Signed)
Pt in via POV with complaints of fever and generalized body aches x 2 days.  Pt ambulatory to triage, NAD noted at this time.

## 2019-12-20 ENCOUNTER — Encounter (HOSPITAL_COMMUNITY): Payer: Self-pay

## 2019-12-20 ENCOUNTER — Ambulatory Visit (HOSPITAL_COMMUNITY)
Admission: EM | Admit: 2019-12-20 | Discharge: 2019-12-20 | Disposition: A | Discharge status: Home / Self Care | Payer: Medicaid Other | Attending: Family Medicine | Admitting: Family Medicine

## 2019-12-20 ENCOUNTER — Other Ambulatory Visit: Payer: Self-pay

## 2019-12-20 DIAGNOSIS — M545 Low back pain, unspecified: Secondary | ICD-10-CM

## 2019-12-20 DIAGNOSIS — M25561 Pain in right knee: Secondary | ICD-10-CM | POA: Diagnosis not present
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
## 2019-12-20 NOTE — Discharge Instructions (Signed)
Please use ice  Please use ibuprofen for pain  Please try compression  Please follow up if your symptoms fail to improve.

## 2020-04-03 ENCOUNTER — Other Ambulatory Visit: Payer: Self-pay

## 2020-04-03 ENCOUNTER — Ambulatory Visit (HOSPITAL_COMMUNITY)
Admission: EM | Admit: 2020-04-03 | Discharge: 2020-04-03 | Disposition: A | Discharge status: Home / Self Care | Payer: Medicaid Other

## 2020-04-03 NOTE — ED Notes (Signed)
Pt called for registration x 3 with no answer.

## 2020-04-07 ENCOUNTER — Encounter (HOSPITAL_BASED_OUTPATIENT_CLINIC_OR_DEPARTMENT_OTHER): Payer: Self-pay

## 2020-04-07 ENCOUNTER — Emergency Department (HOSPITAL_BASED_OUTPATIENT_CLINIC_OR_DEPARTMENT_OTHER)
Admission: EM | Admit: 2020-04-07 | Discharge: 2020-04-07 | Disposition: A | Discharge status: Home / Self Care | Payer: Medicaid Other | Attending: Emergency Medicine | Admitting: Emergency Medicine

## 2020-04-07 ENCOUNTER — Other Ambulatory Visit: Payer: Self-pay

## 2020-04-07 DIAGNOSIS — Y999 Unspecified external cause status: Secondary | ICD-10-CM | POA: Diagnosis not present

## 2020-04-07 DIAGNOSIS — M549 Dorsalgia, unspecified: Secondary | ICD-10-CM | POA: Insufficient documentation

## 2020-04-07 DIAGNOSIS — Y939 Activity, unspecified: Secondary | ICD-10-CM | POA: Insufficient documentation

## 2020-04-07 DIAGNOSIS — Z5321 Procedure and treatment not carried out due to patient leaving prior to being seen by health care provider: Secondary | ICD-10-CM | POA: Insufficient documentation

## 2020-04-07 DIAGNOSIS — Y929 Unspecified place or not applicable: Secondary | ICD-10-CM | POA: Insufficient documentation

## 2020-04-07 DIAGNOSIS — M542 Cervicalgia: Secondary | ICD-10-CM | POA: Diagnosis not present

## 2020-04-07 MED ORDER — IBUPROFEN 400 MG PO TABS
400.0000 mg | ORAL_TABLET | Freq: Once | ORAL | Status: AC | PRN
  Administered 2020-04-07: 400 mg via ORAL
  Filled 2020-04-07: qty 1

## 2020-04-07 NOTE — ED Notes (Signed)
Pt informed registration they were leaving 
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
## 2020-04-09 ENCOUNTER — Ambulatory Visit (HOSPITAL_COMMUNITY)
Admission: EM | Admit: 2020-04-09 | Discharge: 2020-04-09 | Disposition: A | Discharge status: Home / Self Care | Payer: Medicaid Other | Attending: Family Medicine | Admitting: Family Medicine

## 2020-04-09 ENCOUNTER — Other Ambulatory Visit: Payer: Self-pay

## 2020-04-09 ENCOUNTER — Encounter (HOSPITAL_COMMUNITY): Payer: Self-pay

## 2020-04-09 ENCOUNTER — Ambulatory Visit: Admit: 2020-04-09 | Disposition: A | Discharge status: Home / Self Care | Payer: Self-pay

## 2020-04-09 DIAGNOSIS — S39012A Strain of muscle, fascia and tendon of lower back, initial encounter: Secondary | ICD-10-CM | POA: Diagnosis not present

## 2020-04-09 DIAGNOSIS — M542 Cervicalgia: Secondary | ICD-10-CM

## 2020-04-09 MED ORDER — IBUPROFEN 800 MG PO TABS: 800.0000 mg | ORAL_TABLET | Freq: Three times a day (TID) | ORAL | 0 refills | Status: DC

## 2020-04-09 MED ORDER — TIZANIDINE HCL 4 MG PO TABS: 4.0000 mg | ORAL_TABLET | Freq: Four times a day (QID) | ORAL | 0 refills | Status: DC | PRN

## 2020-04-09 MED ORDER — HYDROCODONE-ACETAMINOPHEN 7.5-325 MG PO TABS: 1.0000 | ORAL_TABLET | Freq: Four times a day (QID) | ORAL | 0 refills | Status: DC | PRN
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
## 2020-04-09 NOTE — Discharge Instructions (Addendum)
Take ibuprofen 3 times a day with food This is for pain and inflammation Take tizanidine for muscle relaxer.  This is useful at bedtime Take hydrocodone if pain is severe.  Do not drive on hydrocodone.  This will not be refilled Call tomorrow to [facility].  They were the orthopedics on-call.  They will see you in follow-up
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
## 2020-07-14 ENCOUNTER — Inpatient Hospital Stay (HOSPITAL_COMMUNITY)
Admission: EM | Admit: 2020-07-14 | Discharge: 2020-07-21 | DRG: 481 | Disposition: A | Discharge status: Home / Self Care | Payer: Medicaid Other | Attending: Surgery | Admitting: Surgery

## 2020-07-14 ENCOUNTER — Inpatient Hospital Stay (HOSPITAL_COMMUNITY): Payer: Medicaid Other | Admitting: Certified Registered Nurse Anesthetist

## 2020-07-14 ENCOUNTER — Encounter (HOSPITAL_COMMUNITY): Admission: EM | Disposition: A | Discharge status: Home / Self Care | Payer: Self-pay | Source: Home / Self Care

## 2020-07-14 ENCOUNTER — Other Ambulatory Visit: Payer: Self-pay

## 2020-07-14 ENCOUNTER — Emergency Department (HOSPITAL_COMMUNITY): Payer: Medicaid Other

## 2020-07-14 ENCOUNTER — Inpatient Hospital Stay (HOSPITAL_COMMUNITY): Payer: Medicaid Other

## 2020-07-14 ENCOUNTER — Encounter (HOSPITAL_COMMUNITY): Payer: Self-pay | Admitting: *Deleted

## 2020-07-14 DIAGNOSIS — S72361B Displaced segmental fracture of shaft of right femur, initial encounter for open fracture type I or II: Secondary | ICD-10-CM

## 2020-07-14 DIAGNOSIS — T148XXA Other injury of unspecified body region, initial encounter: Secondary | ICD-10-CM

## 2020-07-14 DIAGNOSIS — S82044A Nondisplaced comminuted fracture of right patella, initial encounter for closed fracture: Secondary | ICD-10-CM

## 2020-07-14 DIAGNOSIS — S72351A Displaced comminuted fracture of shaft of right femur, initial encounter for closed fracture: Secondary | ICD-10-CM

## 2020-07-14 DIAGNOSIS — W3400XA Accidental discharge from unspecified firearms or gun, initial encounter: Principal | ICD-10-CM

## 2020-07-14 DIAGNOSIS — S82041A Displaced comminuted fracture of right patella, initial encounter for closed fracture: Secondary | ICD-10-CM

## 2020-07-14 DIAGNOSIS — Z419 Encounter for procedure for purposes other than remedying health state, unspecified: Secondary | ICD-10-CM

## 2020-07-14 DIAGNOSIS — T1490XA Injury, unspecified, initial encounter: Secondary | ICD-10-CM

## 2020-07-14 DIAGNOSIS — E8889 Other specified metabolic disorders: Secondary | ICD-10-CM | POA: Diagnosis present

## 2020-07-14 DIAGNOSIS — Z20822 Contact with and (suspected) exposure to covid-19: Secondary | ICD-10-CM | POA: Diagnosis present

## 2020-07-14 DIAGNOSIS — N179 Acute kidney failure, unspecified: Secondary | ICD-10-CM | POA: Diagnosis present

## 2020-07-14 DIAGNOSIS — S72401A Unspecified fracture of lower end of right femur, initial encounter for closed fracture: Secondary | ICD-10-CM | POA: Diagnosis present

## 2020-07-14 DIAGNOSIS — Z23 Encounter for immunization: Secondary | ICD-10-CM

## 2020-07-14 DIAGNOSIS — W19XXXA Unspecified fall, initial encounter: Secondary | ICD-10-CM | POA: Diagnosis present

## 2020-07-14 DIAGNOSIS — R202 Paresthesia of skin: Secondary | ICD-10-CM | POA: Diagnosis present

## 2020-07-14 DIAGNOSIS — R200 Anesthesia of skin: Secondary | ICD-10-CM | POA: Diagnosis present

## 2020-07-14 DIAGNOSIS — D62 Acute posthemorrhagic anemia: Secondary | ICD-10-CM | POA: Diagnosis not present

## 2020-07-14 DIAGNOSIS — I70209 Unspecified atherosclerosis of native arteries of extremities, unspecified extremity: Secondary | ICD-10-CM | POA: Diagnosis present

## 2020-07-14 DIAGNOSIS — J45909 Unspecified asthma, uncomplicated: Secondary | ICD-10-CM | POA: Diagnosis present

## 2020-07-14 DIAGNOSIS — S72353A Displaced comminuted fracture of shaft of unspecified femur, initial encounter for closed fracture: Secondary | ICD-10-CM | POA: Diagnosis present

## 2020-07-14 DIAGNOSIS — Y249XXA Unspecified firearm discharge, undetermined intent, initial encounter: Secondary | ICD-10-CM

## 2020-07-14 DIAGNOSIS — E559 Vitamin D deficiency, unspecified: Secondary | ICD-10-CM | POA: Diagnosis present

## 2020-07-14 DIAGNOSIS — F17210 Nicotine dependence, cigarettes, uncomplicated: Secondary | ICD-10-CM | POA: Diagnosis present

## 2020-07-14 DIAGNOSIS — S72361A Displaced segmental fracture of shaft of right femur, initial encounter for closed fracture: Principal | ICD-10-CM | POA: Diagnosis present

## 2020-07-14 HISTORY — PX: EXTERNAL FIXATION LEG: SHX1549

## 2020-07-14 HISTORY — DX: Unspecified asthma, uncomplicated: J45.909

## 2020-07-14 HISTORY — PX: FEMORAL-POPLITEAL BYPASS GRAFT: SHX937

## 2020-07-14 HISTORY — PX: FASCIOTOMY: SHX132

## 2020-07-14 HISTORY — PX: FEMORAL ARTERY EXPLORATION: SHX5160

## 2020-07-14 HISTORY — PX: APPLICATION OF WOUND VAC: SHX5189

## 2020-07-14 LAB — BASIC METABOLIC PANEL
Anion gap: 11 (ref 5–15)
BUN: 11 mg/dL (ref 6–20)
CO2: 20 mmol/L — ABNORMAL LOW (ref 22–32)
Calcium: 7.3 mg/dL — ABNORMAL LOW (ref 8.9–10.3)
Chloride: 107 mmol/L (ref 98–111)
Creatinine, Ser: 1 mg/dL (ref 0.61–1.24)
GFR, Estimated: >60 mL/min (ref >60)
Glucose, Bld: 235 mg/dL — ABNORMAL HIGH (ref 70–99)
Potassium: 4.5 mmol/L (ref 3.5–5.1)
Sodium: 138 mmol/L (ref 135–145)

## 2020-07-14 LAB — COMPREHENSIVE METABOLIC PANEL
ALT: 24 U/L (ref 0–44)
AST: 27 U/L (ref 15–41)
Albumin: 3.5 g/dL (ref 3.5–5.0)
Alkaline Phosphatase: 43 U/L (ref 38–126)
Anion gap: 16 — ABNORMAL HIGH (ref 5–15)
BUN: 9 mg/dL (ref 6–20)
CO2: 20 mmol/L — ABNORMAL LOW (ref 22–32)
Calcium: 8.3 mg/dL — ABNORMAL LOW (ref 8.9–10.3)
Chloride: 103 mmol/L (ref 98–111)
Creatinine, Ser: 1.17 mg/dL (ref 0.61–1.24)
GFR, Estimated: >60 mL/min (ref >60)
Glucose, Bld: 206 mg/dL — ABNORMAL HIGH (ref 70–99)
Potassium: 2.7 mmol/L — CL (ref 3.5–5.1)
Sodium: 139 mmol/L (ref 135–145)
Total Bilirubin: 0.7 mg/dL (ref 0.3–1.2)
Total Protein: 5.8 g/dL — ABNORMAL LOW (ref 6.5–8.1)

## 2020-07-14 LAB — TRAUMA TEG PANEL
CFF Max Amplitude: 13.9 mm — ABNORMAL LOW (ref 15–32)
Citrated Kaolin (R): 6.2 min (ref 4.6–9.1)
Citrated Rapid TEG (MA): 47.5 mm — ABNORMAL LOW (ref 52–70)
Lysis at 30 Minutes: 1.5 % (ref 0.0–2.6)

## 2020-07-14 LAB — I-STAT CHEM 8, ED
BUN: 10 mg/dL (ref 6–20)
Calcium, Ion: 1.06 mmol/L — ABNORMAL LOW (ref 1.15–1.40)
Chloride: 100 mmol/L (ref 98–111)
Creatinine, Ser: 1.2 mg/dL (ref 0.61–1.24)
Glucose, Bld: 200 mg/dL — ABNORMAL HIGH (ref 70–99)
HCT: 41 % (ref 39.0–52.0)
Hemoglobin: 13.9 g/dL (ref 13.0–17.0)
Potassium: 2.7 mmol/L — CL (ref 3.5–5.1)
Sodium: 141 mmol/L (ref 135–145)
TCO2: 21 mmol/L — ABNORMAL LOW (ref 22–32)

## 2020-07-14 LAB — CBC
HCT: 39.5 % (ref 39.0–52.0)
HCT: 26.9 % — ABNORMAL LOW (ref 39.0–52.0)
Hemoglobin: 12.3 g/dL — ABNORMAL LOW (ref 13.0–17.0)
Hemoglobin: 8.9 g/dL — ABNORMAL LOW (ref 13.0–17.0)
MCH: 27.3 pg (ref 26.0–34.0)
MCH: 28.3 pg (ref 26.0–34.0)
MCHC: 31.1 g/dL (ref 30.0–36.0)
MCHC: 33.1 g/dL (ref 30.0–36.0)
MCV: 87.6 fL (ref 80.0–100.0)
MCV: 85.4 fL (ref 80.0–100.0)
Platelets: 191 10*3/uL (ref 150–400)
Platelets: 120 10*3/uL — ABNORMAL LOW (ref 150–400)
RBC: 4.51 MIL/uL (ref 4.22–5.81)
RBC: 3.15 MIL/uL — ABNORMAL LOW (ref 4.22–5.81)
RDW: 13.5 % (ref 11.5–15.5)
RDW: 14.1 % (ref 11.5–15.5)
WBC: 5.4 10*3/uL (ref 4.0–10.5)
WBC: 29.7 10*3/uL — ABNORMAL HIGH (ref 4.0–10.5)
nRBC: 0 % (ref 0.0–0.2)
nRBC: 0.1 % (ref 0.0–0.2)

## 2020-07-14 LAB — MRSA PCR SCREENING: MRSA by PCR: NEGATIVE

## 2020-07-14 LAB — HIV ANTIBODY (ROUTINE TESTING W REFLEX): HIV Screen 4th Generation wRfx: NONREACTIVE

## 2020-07-14 LAB — RESPIRATORY PANEL BY RT PCR (FLU A&B, COVID)
Influenza A by PCR: NEGATIVE
Influenza B by PCR: NEGATIVE
SARS Coronavirus 2 by RT PCR: NEGATIVE

## 2020-07-14 LAB — LACTIC ACID, PLASMA
Lactic Acid, Venous: 6.3 mmol/L (ref 0.5–1.9)
Lactic Acid, Venous: 5.2 mmol/L (ref 0.5–1.9)

## 2020-07-14 LAB — HEMOGLOBIN AND HEMATOCRIT, BLOOD
HCT: 27.3 % — ABNORMAL LOW (ref 39.0–52.0)
Hemoglobin: 8.8 g/dL — ABNORMAL LOW (ref 13.0–17.0)

## 2020-07-14 LAB — ETHANOL: Alcohol, Ethyl (B): 72 mg/dL — ABNORMAL HIGH (ref <10)

## 2020-07-14 LAB — CBG MONITORING, ED: Glucose-Capillary: 191 mg/dL — ABNORMAL HIGH (ref 70–99)

## 2020-07-14 LAB — PROTIME-INR
INR: 1.1 (ref 0.8–1.2)
Prothrombin Time: 13.8 seconds (ref 11.4–15.2)

## 2020-07-14 SURGERY — EXPLORATION, ARTERY, FEMORAL
Anesthesia: General | Site: Thigh | Laterality: Right

## 2020-07-14 MED ORDER — GABAPENTIN 300 MG PO CAPS
300.0000 mg | ORAL_CAPSULE | Freq: Three times a day (TID) | ORAL | Status: DC
  Administered 2020-07-14 – 2020-07-17 (×7): 300 mg via ORAL
  Filled 2020-07-14 (×5): qty 1
  Filled 2020-07-14: qty 3
  Filled 2020-07-14 (×2): qty 1

## 2020-07-14 MED ORDER — FENTANYL CITRATE (PF) 100 MCG/2ML IJ SOLN
50.0000 ug | Freq: Once | INTRAMUSCULAR | Status: AC
  Administered 2020-07-14: 50 ug via INTRAVENOUS

## 2020-07-14 MED ORDER — FENTANYL CITRATE (PF) 100 MCG/2ML IJ SOLN: 50.0000 ug | Freq: Once | INTRAMUSCULAR | Status: DC

## 2020-07-14 MED ORDER — ONDANSETRON HCL 4 MG/2ML IJ SOLN
INTRAMUSCULAR | Status: AC
  Filled 2020-07-14: qty 2

## 2020-07-14 MED ORDER — ONDANSETRON HCL 4 MG/2ML IJ SOLN: 4.0000 mg | Freq: Four times a day (QID) | INTRAMUSCULAR | Status: DC | PRN

## 2020-07-14 MED ORDER — FENTANYL CITRATE (PF) 250 MCG/5ML IJ SOLN
INTRAMUSCULAR | Status: DC | PRN
  Administered 2020-07-14: 50 ug via INTRAVENOUS
  Administered 2020-07-14: 25 ug via INTRAVENOUS
  Administered 2020-07-14 (×2): 50 ug via INTRAVENOUS
  Administered 2020-07-14 (×5): 25 ug via INTRAVENOUS
  Administered 2020-07-14: 150 ug via INTRAVENOUS
  Administered 2020-07-14 (×4): 25 ug via INTRAVENOUS

## 2020-07-14 MED ORDER — OXYCODONE HCL 5 MG PO TABS
5.0000 mg | ORAL_TABLET | ORAL | Status: DC | PRN
  Administered 2020-07-14 – 2020-07-17 (×10): 10 mg via ORAL
  Filled 2020-07-14 (×10): qty 2

## 2020-07-14 MED ORDER — POTASSIUM CHLORIDE 10 MEQ/100ML IV SOLN: 10.0000 meq | INTRAVENOUS | Status: AC

## 2020-07-14 MED ORDER — PROCHLORPERAZINE MALEATE 10 MG PO TABS
10.0000 mg | ORAL_TABLET | Freq: Four times a day (QID) | ORAL | Status: DC | PRN
  Filled 2020-07-14: qty 1

## 2020-07-14 MED ORDER — CEFAZOLIN SODIUM-DEXTROSE 2-4 GM/100ML-% IV SOLN
2.0000 g | Freq: Once | INTRAVENOUS | Status: AC
  Administered 2020-07-14: 2 g via INTRAVENOUS
  Filled 2020-07-14: qty 100

## 2020-07-14 MED ORDER — MIDAZOLAM HCL 2 MG/2ML IJ SOLN
INTRAMUSCULAR | Status: AC
  Filled 2020-07-14: qty 2

## 2020-07-14 MED ORDER — DIPHENHYDRAMINE HCL 50 MG/ML IJ SOLN
12.5000 mg | Freq: Four times a day (QID) | INTRAMUSCULAR | Status: DC | PRN
  Filled 2020-07-14: qty 1

## 2020-07-14 MED ORDER — DIPHENHYDRAMINE HCL 12.5 MG/5ML PO ELIX
12.5000 mg | ORAL_SOLUTION | Freq: Four times a day (QID) | ORAL | Status: DC | PRN
  Administered 2020-07-17: 12.5 mg via ORAL
  Filled 2020-07-14 (×2): qty 5

## 2020-07-14 MED ORDER — HEPARIN SODIUM (PORCINE) 1000 UNIT/ML IJ SOLN
INTRAMUSCULAR | Status: DC | PRN
  Administered 2020-07-14: 8000 [IU] via INTRAVENOUS

## 2020-07-14 MED ORDER — LIDOCAINE 2% (20 MG/ML) 5 ML SYRINGE
INTRAMUSCULAR | Status: DC | PRN
  Administered 2020-07-14: 60 mg via INTRAVENOUS

## 2020-07-14 MED ORDER — ONDANSETRON 4 MG PO TBDP: 4.0000 mg | ORAL_TABLET | Freq: Four times a day (QID) | ORAL | Status: DC | PRN

## 2020-07-14 MED ORDER — PROTAMINE SULFATE 10 MG/ML IV SOLN
INTRAVENOUS | Status: DC | PRN
  Administered 2020-07-14: 50 mg via INTRAVENOUS

## 2020-07-14 MED ORDER — FENTANYL CITRATE (PF) 250 MCG/5ML IJ SOLN
INTRAMUSCULAR | Status: AC
  Filled 2020-07-14: qty 5

## 2020-07-14 MED ORDER — SUCCINYLCHOLINE CHLORIDE 200 MG/10ML IV SOSY
PREFILLED_SYRINGE | INTRAVENOUS | Status: DC | PRN
  Administered 2020-07-14: 120 mg via INTRAVENOUS

## 2020-07-14 MED ORDER — SODIUM CHLORIDE 0.9 % IV BOLUS (SEPSIS)
1000.0000 mL | Freq: Once | INTRAVENOUS | Status: AC
  Administered 2020-07-14: 1000 mL via INTRAVENOUS

## 2020-07-14 MED ORDER — PAPAVERINE HCL 30 MG/ML IJ SOLN
INTRAMUSCULAR | Status: AC
  Filled 2020-07-14: qty 2

## 2020-07-14 MED ORDER — PROPOFOL 10 MG/ML IV BOLUS
INTRAVENOUS | Status: DC | PRN
  Administered 2020-07-14: 110 mg via INTRAVENOUS

## 2020-07-14 MED ORDER — THROMBIN (RECOMBINANT) 20000 UNITS EX SOLR
CUTANEOUS | Status: AC
  Filled 2020-07-14: qty 20000

## 2020-07-14 MED ORDER — FENTANYL CITRATE (PF) 100 MCG/2ML IJ SOLN
100.0000 ug | Freq: Once | INTRAMUSCULAR | Status: AC
  Administered 2020-07-14: 100 ug via INTRAVENOUS

## 2020-07-14 MED ORDER — PAPAVERINE HCL 30 MG/ML IJ SOLN
INTRAMUSCULAR | Status: DC | PRN
  Administered 2020-07-14: 60 mg via INTRAVENOUS

## 2020-07-14 MED ORDER — LACTATED RINGERS IV SOLN: INTRAVENOUS | Status: DC | PRN

## 2020-07-14 MED ORDER — KETOROLAC TROMETHAMINE 15 MG/ML IJ SOLN
30.0000 mg | Freq: Four times a day (QID) | INTRAMUSCULAR | Status: DC
  Administered 2020-07-14 – 2020-07-15 (×3): 30 mg via INTRAVENOUS
  Filled 2020-07-14 (×3): qty 2

## 2020-07-14 MED ORDER — ENOXAPARIN SODIUM 30 MG/0.3ML ~~LOC~~ SOLN
30.0000 mg | Freq: Two times a day (BID) | SUBCUTANEOUS | Status: DC
  Administered 2020-07-14 – 2020-07-15 (×2): 30 mg via SUBCUTANEOUS
  Filled 2020-07-14 (×2): qty 0.3

## 2020-07-14 MED ORDER — ONDANSETRON HCL 4 MG/2ML IJ SOLN
4.0000 mg | Freq: Once | INTRAMUSCULAR | Status: AC
  Administered 2020-07-14: 4 mg via INTRAVENOUS

## 2020-07-14 MED ORDER — METHOCARBAMOL 500 MG PO TABS
1000.0000 mg | ORAL_TABLET | Freq: Three times a day (TID) | ORAL | Status: DC
  Administered 2020-07-14 – 2020-07-20 (×17): 1000 mg via ORAL
  Filled 2020-07-14 (×17): qty 2

## 2020-07-14 MED ORDER — ASPIRIN EC 81 MG PO TBEC
81.0000 mg | DELAYED_RELEASE_TABLET | Freq: Every day | ORAL | Status: DC
  Administered 2020-07-14 – 2020-07-21 (×6): 81 mg via ORAL
  Filled 2020-07-14 (×7): qty 1

## 2020-07-14 MED ORDER — TETANUS-DIPHTH-ACELL PERTUSSIS 5-2.5-18.5 LF-MCG/0.5 IM SUSY
0.5000 mL | PREFILLED_SYRINGE | Freq: Once | INTRAMUSCULAR | Status: AC
  Administered 2020-07-14: 0.5 mL via INTRAMUSCULAR
  Filled 2020-07-14: qty 0.5

## 2020-07-14 MED ORDER — SODIUM CHLORIDE 0.9 % IV SOLN
INTRAVENOUS | Status: AC
  Filled 2020-07-14: qty 1.2

## 2020-07-14 MED ORDER — LACTATED RINGERS IV BOLUS
2000.0000 mL | Freq: Once | INTRAVENOUS | Status: AC
  Administered 2020-07-14: 2000 mL via INTRAVENOUS

## 2020-07-14 MED ORDER — CEFAZOLIN SODIUM-DEXTROSE 2-4 GM/100ML-% IV SOLN
2.0000 g | Freq: Three times a day (TID) | INTRAVENOUS | Status: DC
  Administered 2020-07-14 – 2020-07-15 (×4): 2 g via INTRAVENOUS
  Filled 2020-07-14 (×4): qty 100

## 2020-07-14 MED ORDER — ALBUMIN HUMAN 5 % IV SOLN: INTRAVENOUS | Status: DC | PRN

## 2020-07-14 MED ORDER — CHLORHEXIDINE GLUCONATE CLOTH 2 % EX PADS
6.0000 | MEDICATED_PAD | Freq: Every day | CUTANEOUS | Status: DC
  Administered 2020-07-15 – 2020-07-21 (×5): 6 via TOPICAL

## 2020-07-14 MED ORDER — ACETAMINOPHEN 500 MG PO TABS
1000.0000 mg | ORAL_TABLET | Freq: Four times a day (QID) | ORAL | Status: DC
  Administered 2020-07-14 – 2020-07-21 (×25): 1000 mg via ORAL
  Filled 2020-07-14 (×25): qty 2

## 2020-07-14 MED ORDER — PROCHLORPERAZINE EDISYLATE 10 MG/2ML IJ SOLN: 5.0000 mg | Freq: Four times a day (QID) | INTRAMUSCULAR | Status: DC | PRN

## 2020-07-14 MED ORDER — HYDROMORPHONE HCL 1 MG/ML IJ SOLN
0.5000 mg | INTRAMUSCULAR | Status: DC | PRN
  Administered 2020-07-14 – 2020-07-17 (×6): 0.5 mg via INTRAVENOUS
  Filled 2020-07-14 (×6): qty 1

## 2020-07-14 MED ORDER — DOCUSATE SODIUM 100 MG PO CAPS
100.0000 mg | ORAL_CAPSULE | Freq: Two times a day (BID) | ORAL | Status: DC
  Administered 2020-07-14 – 2020-07-21 (×11): 100 mg via ORAL
  Filled 2020-07-14 (×13): qty 1

## 2020-07-14 MED ORDER — PHENYLEPHRINE 40 MCG/ML (10ML) SYRINGE FOR IV PUSH (FOR BLOOD PRESSURE SUPPORT): PREFILLED_SYRINGE | INTRAVENOUS | Status: DC | PRN

## 2020-07-14 MED ORDER — ONDANSETRON HCL 4 MG/2ML IJ SOLN
INTRAMUSCULAR | Status: DC | PRN
  Administered 2020-07-14: 4 mg via INTRAVENOUS

## 2020-07-14 MED ORDER — CEFAZOLIN SODIUM-DEXTROSE 2-3 GM-%(50ML) IV SOLR
INTRAVENOUS | Status: DC | PRN
  Administered 2020-07-14: 2 g via INTRAVENOUS

## 2020-07-14 MED ORDER — SODIUM CHLORIDE 0.9% FLUSH: 9.0000 mL | INTRAVENOUS | Status: DC | PRN

## 2020-07-14 MED ORDER — SODIUM CHLORIDE 0.9 % IV SOLN
INTRAVENOUS | Status: DC | PRN
  Administered 2020-07-14: 500 mL

## 2020-07-14 MED ORDER — FENTANYL CITRATE (PF) 100 MCG/2ML IJ SOLN
INTRAMUSCULAR | Status: AC
  Filled 2020-07-14: qty 2

## 2020-07-14 MED ORDER — SUGAMMADEX SODIUM 200 MG/2ML IV SOLN
INTRAVENOUS | Status: DC | PRN
  Administered 2020-07-14: 170 mg via INTRAVENOUS

## 2020-07-14 MED ORDER — IOHEXOL 350 MG/ML SOLN
100.0000 mL | Freq: Once | INTRAVENOUS | Status: AC | PRN
  Administered 2020-07-14: 100 mL via INTRAVENOUS

## 2020-07-14 MED ORDER — ROCURONIUM BROMIDE 10 MG/ML (PF) SYRINGE
PREFILLED_SYRINGE | INTRAVENOUS | Status: DC | PRN
  Administered 2020-07-14: 20 mg via INTRAVENOUS
  Administered 2020-07-14: 60 mg via INTRAVENOUS
  Administered 2020-07-14: 20 mg via INTRAVENOUS

## 2020-07-14 MED ORDER — DEXAMETHASONE SODIUM PHOSPHATE 10 MG/ML IJ SOLN
INTRAMUSCULAR | Status: DC | PRN
  Administered 2020-07-14: 10 mg via INTRAVENOUS

## 2020-07-14 MED ORDER — NALOXONE HCL 0.4 MG/ML IJ SOLN: 0.4000 mg | INTRAMUSCULAR | Status: DC | PRN

## 2020-07-14 MED ORDER — LORAZEPAM 2 MG/ML IJ SOLN
1.0000 mg | Freq: Once | INTRAMUSCULAR | Status: AC
  Administered 2020-07-14: 1 mg via INTRAVENOUS
  Filled 2020-07-14: qty 1

## 2020-07-14 MED ORDER — SODIUM CHLORIDE 0.9 % IV SOLN: INTRAVENOUS | Status: DC | PRN

## 2020-07-14 MED ORDER — MIDAZOLAM HCL 2 MG/2ML IJ SOLN
INTRAMUSCULAR | Status: DC | PRN
  Administered 2020-07-14: 2 mg via INTRAVENOUS

## 2020-07-14 MED ORDER — 0.9 % SODIUM CHLORIDE (POUR BTL) OPTIME
TOPICAL | Status: DC | PRN
  Administered 2020-07-14: 2000 mL

## 2020-07-14 MED ORDER — HYDROMORPHONE 1 MG/ML IV SOLN: INTRAVENOUS | Status: DC

## 2020-07-14 MED ORDER — ACETAMINOPHEN 325 MG PO TABS
650.0000 mg | ORAL_TABLET | Freq: Four times a day (QID) | ORAL | Status: DC
  Administered 2020-07-14 (×2): 650 mg via ORAL
  Filled 2020-07-14 (×2): qty 2

## 2020-07-14 MED ORDER — HYDROMORPHONE HCL 1 MG/ML IJ SOLN: 0.2500 mg | INTRAMUSCULAR | Status: DC | PRN

## 2020-07-14 MED ORDER — LACTATED RINGERS IV SOLN: INTRAVENOUS | Status: DC

## 2020-07-14 SURGICAL SUPPLY — 87 items
ADH SKN CLS APL DERMABOND .7 (GAUZE/BANDAGES/DRESSINGS) ×6
BANDAGE ESMARK 6X9 LF (GAUZE/BANDAGES/DRESSINGS) IMPLANT
BAR EXFX 500X11 NS LF (EXFIX) ×6
BAR GLASS FIBER EXFX 11X500 (EXFIX) ×4 IMPLANT
BLADE SURG 10 STRL SS (BLADE) ×2 IMPLANT
BNDG CMPR 9X6 STRL LF SNTH (GAUZE/BANDAGES/DRESSINGS)
BNDG ELASTIC 4X5.8 VLCR STR LF (GAUZE/BANDAGES/DRESSINGS) IMPLANT
BNDG ESMARK 6X9 LF (GAUZE/BANDAGES/DRESSINGS)
BNDG GAUZE ELAST 4 BULKY (GAUZE/BANDAGES/DRESSINGS) ×2 IMPLANT
CANISTER SUCT 3000ML PPV (MISCELLANEOUS) ×5 IMPLANT
CANISTER WOUNDNEG PRESSURE 500 (CANNISTER) ×4 IMPLANT
CANNULA VESSEL 3MM 2 BLNT TIP (CANNULA) ×8 IMPLANT
CLAMP BLUE BAR TO BAR (EXFIX) ×2 IMPLANT
CLAMP BLUE BAR TO PIN (EXFIX) ×8 IMPLANT
CLIP VESOCCLUDE MED 24/CT (CLIP) ×5 IMPLANT
CLIP VESOCCLUDE SM WIDE 24/CT (CLIP) ×5 IMPLANT
COVER WAND RF STERILE (DRAPES) ×5 IMPLANT
CUFF TOURN SGL QUICK 24 (TOURNIQUET CUFF)
CUFF TOURN SGL QUICK 34 (TOURNIQUET CUFF)
CUFF TOURN SGL QUICK 42 (TOURNIQUET CUFF) IMPLANT
CUFF TRNQT CYL 24X4X16.5-23 (TOURNIQUET CUFF) IMPLANT
CUFF TRNQT CYL 34X4.125X (TOURNIQUET CUFF) IMPLANT
DERMABOND ADVANCED (GAUZE/BANDAGES/DRESSINGS) ×4
DERMABOND ADVANCED .7 DNX12 (GAUZE/BANDAGES/DRESSINGS) IMPLANT
DRAIN CHANNEL 15F RND FF W/TCR (WOUND CARE) IMPLANT
DRAIN PENROSE 3/4X12 (DRAIN) IMPLANT
DRAPE C-ARM 42X72 X-RAY (DRAPES) ×2 IMPLANT
DRAPE X-RAY CASS 24X20 (DRAPES) IMPLANT
DRSG COVADERM 4X6 (GAUZE/BANDAGES/DRESSINGS) ×8 IMPLANT
DRSG VAC ATS LRG SENSATRAC (GAUZE/BANDAGES/DRESSINGS) ×2 IMPLANT
DRSG VAC ATS MED SENSATRAC (GAUZE/BANDAGES/DRESSINGS) ×2 IMPLANT
ELECT KIT SAFEOP EMG/NMJ (KITS) ×5
ELECT REM PT RETURN 9FT ADLT (ELECTROSURGICAL) ×5
ELECTRODE REM PT RTRN 9FT ADLT (ELECTROSURGICAL) ×3 IMPLANT
EVACUATOR SILICONE 100CC (DRAIN) IMPLANT
GAUZE 4X4 16PLY RFD (DISPOSABLE) ×2 IMPLANT
GAUZE SPONGE 4X4 12PLY STRL (GAUZE/BANDAGES/DRESSINGS) ×2 IMPLANT
GAUZE XEROFORM 1X8 LF (GAUZE/BANDAGES/DRESSINGS) ×2 IMPLANT
GAUZE XEROFORM 5X9 LF (GAUZE/BANDAGES/DRESSINGS) ×2 IMPLANT
GLOVE BIO SURGEON STRL SZ7.5 (GLOVE) ×9 IMPLANT
GLOVE BIO SURGEON STRL SZ8 (GLOVE) ×2 IMPLANT
GLOVE BIOGEL M 6.5 STRL (GLOVE) ×8 IMPLANT
GLOVE BIOGEL M 7.0 STRL (GLOVE) ×2 IMPLANT
GLOVE BIOGEL PI IND STRL 6.5 (GLOVE) IMPLANT
GLOVE BIOGEL PI IND STRL 7.0 (GLOVE) IMPLANT
GLOVE BIOGEL PI IND STRL 8 (GLOVE) ×3 IMPLANT
GLOVE BIOGEL PI INDICATOR 6.5 (GLOVE) ×6
GLOVE BIOGEL PI INDICATOR 7.0 (GLOVE) ×6
GLOVE BIOGEL PI INDICATOR 8 (GLOVE) ×2
GLOVE ECLIPSE 6.5 STRL STRAW (GLOVE) ×2 IMPLANT
GOWN STRL REUS W/ TWL LRG LVL3 (GOWN DISPOSABLE) ×9 IMPLANT
GOWN STRL REUS W/TWL LRG LVL3 (GOWN DISPOSABLE) ×40
KIT BASIN OR (CUSTOM PROCEDURE TRAY) ×5 IMPLANT
KIT EMG SRFC ELECT (KITS) IMPLANT
KIT TURNOVER KIT B (KITS) ×5 IMPLANT
MARKER GRAFT CORONARY BYPASS (MISCELLANEOUS) ×2 IMPLANT
NS IRRIG 1000ML POUR BTL (IV SOLUTION) ×10 IMPLANT
PACK PERIPHERAL VASCULAR (CUSTOM PROCEDURE TRAY) ×5 IMPLANT
PAD ARMBOARD 7.5X6 YLW CONV (MISCELLANEOUS) ×10 IMPLANT
PIN HALF 5.0X200MM (EXFIX) ×4 IMPLANT
PIN HALF 5X200X85MM EXFIX (EXFIX) ×4 IMPLANT
SET COLLECT BLD 21X3/4 12 (NEEDLE) IMPLANT
SPONGE SURGIFOAM ABS GEL 100 (HEMOSTASIS) IMPLANT
STAPLER VISISTAT (STAPLE) IMPLANT
STAPLER VISISTAT 35W (STAPLE) ×6 IMPLANT
STOPCOCK 4 WAY LG BORE MALE ST (IV SETS) IMPLANT
SUT ETHILON 3 0 PS 1 (SUTURE) IMPLANT
SUT PROLENE 5 0 C 1 24 (SUTURE) ×5 IMPLANT
SUT PROLENE 6 0 BV (SUTURE) ×13 IMPLANT
SUT PROLENE 7 0 BV 1 (SUTURE) IMPLANT
SUT SILK 2 0 (SUTURE) ×5
SUT SILK 2 0 PERMA HAND 18 BK (SUTURE) ×7 IMPLANT
SUT SILK 2-0 18XBRD TIE 12 (SUTURE) IMPLANT
SUT SILK 3 0 (SUTURE)
SUT SILK 3-0 18XBRD TIE 12 (SUTURE) IMPLANT
SUT VIC AB 2-0 CTB1 (SUTURE) ×12 IMPLANT
SUT VIC AB 3-0 SH 27 (SUTURE) ×25
SUT VIC AB 3-0 SH 27X BRD (SUTURE) ×6 IMPLANT
SUT VICRYL 4-0 PS2 18IN ABS (SUTURE) ×16 IMPLANT
SYR 20ML LL LF (SYRINGE) ×2 IMPLANT
SYR 5ML LL (SYRINGE) ×2 IMPLANT
TAPE UMBILICAL COTTON 1/8X30 (MISCELLANEOUS) ×2 IMPLANT
TOWEL GREEN STERILE (TOWEL DISPOSABLE) ×5 IMPLANT
TRAY FOLEY MTR SLVR 16FR STAT (SET/KITS/TRAYS/PACK) ×5 IMPLANT
TUBING EXTENTION W/L.L. (IV SETS) IMPLANT
UNDERPAD 30X36 HEAVY ABSORB (UNDERPADS AND DIAPERS) ×5 IMPLANT
WATER STERILE IRR 1000ML POUR (IV SOLUTION) ×5 IMPLANT
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
## 2020-07-14 NOTE — ED Notes (Signed)
bp 106/68 p93

## 2020-07-14 NOTE — ED Notes (Signed)
TO CT
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
## 2020-07-14 NOTE — ED Notes (Signed)
To or now 
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
## 2020-07-14 NOTE — ED Notes (Signed)
Mother  At bedside
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
## 2020-07-14 NOTE — ED Notes (Signed)
  NSS WIDE OPEN
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
## 2020-07-14 NOTE — ED Notes (Signed)
Sister at  The bedside
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
## 2020-07-14 NOTE — Anesthesia Preprocedure Evaluation (Addendum)
Anesthesia Evaluation  Patient identified by MRN, date of birth, ID band Patient awake    Reviewed: Allergy & Precautions, H&P , NPO status , Patient's Chart, lab work & pertinent test results  Airway Mallampati: II  TM Distance: >3 FB Neck ROM: Full    Dental no notable dental hx. (+) Teeth Intact, Dental Advisory Given   Pulmonary asthma , Current Smoker,    Pulmonary exam normal breath sounds clear to auscultation       Cardiovascular negative cardio ROS   Rhythm:Regular Rate:Normal     Neuro/Psych negative neurological ROS  negative psych ROS   GI/Hepatic negative GI ROS, Neg liver ROS,   Endo/Other  negative endocrine ROS  Renal/GU negative Renal ROS  negative genitourinary   Musculoskeletal   Abdominal   Peds  Hematology negative hematology ROS (+)   Anesthesia Other Findings   Reproductive/Obstetrics negative OB ROS                            Anesthesia Physical Anesthesia Plan  ASA: II and emergent  Anesthesia Plan: General   Post-op Pain Management:    Induction: Intravenous, Rapid sequence and Cricoid pressure planned  PONV Risk Score and Plan: 2 and Ondansetron, Dexamethasone and Midazolam  Airway Management Planned: Oral ETT  Additional Equipment:   Intra-op Plan:   Post-operative Plan: Extubation in OR  Informed Consent: I have reviewed the patients History and Physical, chart, labs and discussed the procedure including the risks, benefits and alternatives for the proposed anesthesia with the patient or authorized representative who has indicated his/her understanding and acceptance.     Dental advisory given  Plan Discussed with: CRNA  Anesthesia Plan Comments:         Anesthesia Quick Evaluation  

## 2020-07-14 NOTE — ED Notes (Signed)
DR Carlene Coria
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
## 2020-07-14 NOTE — ED Notes (Signed)
Dr Freida Busman at [facility]

## 2020-07-14 NOTE — Transfer of Care (Signed)
Immediate Anesthesia Transfer of Care Note  Patient: David Spears  Procedure(s) Performed: ARTERY EXPLORATION AND REPAIR, Right LEG (Right Thigh) EXTERNAL FIXATION LEG (Right Thigh) Right Above the Knee artery bypass to below the knee Popliteal artery  BYPASS GRAFT using Saphenous vein graft from Left leg. (Right Leg Upper) APPLICATION OF WOUND VAC times two to right lower leg. (Right Leg Lower) Four Compartment FASCIOTOMY (Right Leg Lower)  Patient Location: PACU  Anesthesia Type:General  Level of Consciousness: sedated  Airway & Oxygen Therapy: Patient Spontanous Breathing and Patient connected to nasal cannula oxygen  Post-op Assessment: Report given to RN and Post -op Vital signs reviewed and stable  Post vital signs: Reviewed and stable  Last Vitals:  Vitals Value Taken Time  BP 103/65   Temp    Pulse 107 07/14/20 1035  Resp 20 07/14/20 1035  SpO2 100 % 07/14/20 1035  Vitals shown include unvalidated device data.  Last Pain:  Vitals:   07/14/20 0450  PainSc: 10-Worst pain ever         Complications: No complications documented.

## 2020-07-14 NOTE — Anesthesia Procedure Notes (Signed)
Procedure Name: Intubation Date/Time: 07/14/2020 5:59 AM Performed by: Darletta Moll, CRNA Pre-anesthesia Checklist: Patient identified, Emergency Drugs available, Suction available and Patient being monitored Patient Re-evaluated:Patient Re-evaluated prior to induction Oxygen Delivery Method: Circle system utilized Preoxygenation: Pre-oxygenation with 100% oxygen Induction Type: IV induction, Rapid sequence and Cricoid Pressure applied Ventilation: Mask ventilation without difficulty Laryngoscope Size: Mac and 4 Grade View: Grade I Tube type: Oral Tube size: 7.5 mm Number of attempts: 1 Airway Equipment and Method: Stylet Placement Confirmation: ETT inserted through vocal cords under direct vision,  positive ETCO2 and breath sounds checked- equal and bilateral Secured at: 22 cm Tube secured with: Tape Dental Injury: Teeth and Oropharynx as per pre-operative assessment

## 2020-07-14 NOTE — ED Notes (Signed)
THE ORTHO TECH HAS PLACED  A SPLINT ONTO THE  RT LEG
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
## 2020-07-14 NOTE — Progress Notes (Signed)
Orthopedic Tech Progress Note Patient Details:  Zymir Napoli [birth year] [id number]  Ortho Devices Type of Ortho Device: Post (long leg) splint Ortho Device/Splint Location: rle. I applied a posterior long leg splint to the right leg at the DR's request so that the patients leg would be straight and stabilized for the CT. The pt kept moving during splint application. An ED tech assisted me in holding the leg. Ortho Device/Splint Interventions: Ordered, Application, Adjustment   Post Interventions Patient Tolerated: Well Instructions Provided: Care of device, Adjustment of device   Trinna Post 07/14/2020, 5:10 AM
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
## 2020-07-14 NOTE — ED Notes (Signed)
DR Edilia Bo AT [facility]
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
## 2020-07-15 ENCOUNTER — Encounter (HOSPITAL_COMMUNITY): Payer: Self-pay | Admitting: Surgery

## 2020-07-15 ENCOUNTER — Other Ambulatory Visit: Payer: Self-pay

## 2020-07-15 LAB — CBC
HCT: 18.9 % — ABNORMAL LOW (ref 39.0–52.0)
Hemoglobin: 6.5 g/dL — CL (ref 13.0–17.0)
MCH: 28.4 pg (ref 26.0–34.0)
MCHC: 34.4 g/dL (ref 30.0–36.0)
MCV: 82.5 fL (ref 80.0–100.0)
Platelets: 92 10*3/uL — ABNORMAL LOW (ref 150–400)
RBC: 2.29 MIL/uL — ABNORMAL LOW (ref 4.22–5.81)
RDW: 14.1 % (ref 11.5–15.5)
WBC: 22.2 10*3/uL — ABNORMAL HIGH (ref 4.0–10.5)
nRBC: 0.1 % (ref 0.0–0.2)

## 2020-07-15 LAB — POCT I-STAT, CHEM 8
BUN: 10 mg/dL (ref 6–20)
BUN: 10 mg/dL (ref 6–20)
Calcium, Ion: 1.14 mmol/L — ABNORMAL LOW (ref 1.15–1.40)
Calcium, Ion: 1.13 mmol/L — ABNORMAL LOW (ref 1.15–1.40)
Chloride: 105 mmol/L (ref 98–111)
Chloride: 105 mmol/L (ref 98–111)
Creatinine, Ser: 0.8 mg/dL (ref 0.61–1.24)
Creatinine, Ser: 0.8 mg/dL (ref 0.61–1.24)
Glucose, Bld: 126 mg/dL — ABNORMAL HIGH (ref 70–99)
Glucose, Bld: 138 mg/dL — ABNORMAL HIGH (ref 70–99)
HCT: 21 % — ABNORMAL LOW (ref 39.0–52.0)
HCT: 24 % — ABNORMAL LOW (ref 39.0–52.0)
Hemoglobin: 7.1 g/dL — ABNORMAL LOW (ref 13.0–17.0)
Hemoglobin: 8.2 g/dL — ABNORMAL LOW (ref 13.0–17.0)
Potassium: 4.2 mmol/L (ref 3.5–5.1)
Potassium: 4.7 mmol/L (ref 3.5–5.1)
Sodium: 140 mmol/L (ref 135–145)
Sodium: 139 mmol/L (ref 135–145)
TCO2: 21 mmol/L — ABNORMAL LOW (ref 22–32)
TCO2: 22 mmol/L (ref 22–32)

## 2020-07-15 LAB — BASIC METABOLIC PANEL
Anion gap: 9 (ref 5–15)
BUN: 21 mg/dL — ABNORMAL HIGH (ref 6–20)
CO2: 23 mmol/L (ref 22–32)
Calcium: 7.3 mg/dL — ABNORMAL LOW (ref 8.9–10.3)
Chloride: 103 mmol/L (ref 98–111)
Creatinine, Ser: 1.61 mg/dL — ABNORMAL HIGH (ref 0.61–1.24)
GFR, Estimated: 59 mL/min — ABNORMAL LOW (ref >60)
Glucose, Bld: 112 mg/dL — ABNORMAL HIGH (ref 70–99)
Potassium: 5.5 mmol/L — ABNORMAL HIGH (ref 3.5–5.1)
Sodium: 135 mmol/L (ref 135–145)

## 2020-07-15 LAB — PREPARE RBC (CROSSMATCH)

## 2020-07-15 LAB — HEMOGLOBIN AND HEMATOCRIT, BLOOD
HCT: 17.9 % — ABNORMAL LOW (ref 39.0–52.0)
HCT: 19.1 % — ABNORMAL LOW (ref 39.0–52.0)
Hemoglobin: 6.1 g/dL — CL (ref 13.0–17.0)
Hemoglobin: 6.7 g/dL — CL (ref 13.0–17.0)

## 2020-07-15 LAB — ABO/RH: ABO/RH(D): A POS

## 2020-07-15 MED ORDER — THIAMINE HCL 100 MG PO TABS
100.0000 mg | ORAL_TABLET | Freq: Every day | ORAL | Status: DC
  Administered 2020-07-15 – 2020-07-21 (×5): 100 mg via ORAL
  Filled 2020-07-15 (×6): qty 1

## 2020-07-15 MED ORDER — FOLIC ACID 1 MG PO TABS
1.0000 mg | ORAL_TABLET | Freq: Every day | ORAL | Status: DC
  Administered 2020-07-15 – 2020-07-21 (×5): 1 mg via ORAL
  Filled 2020-07-15 (×6): qty 1

## 2020-07-15 MED ORDER — SODIUM CHLORIDE 0.9 % IV SOLN
2.0000 g | INTRAVENOUS | Status: DC
  Administered 2020-07-15: 2 g via INTRAVENOUS
  Filled 2020-07-15: qty 20
  Filled 2020-07-15: qty 2

## 2020-07-15 MED ORDER — SODIUM CHLORIDE 0.9% IV SOLUTION: Freq: Once | INTRAVENOUS | Status: AC

## 2020-07-15 MED ORDER — SODIUM ZIRCONIUM CYCLOSILICATE 10 G PO PACK
10.0000 g | PACK | Freq: Once | ORAL | Status: AC
  Administered 2020-07-15: 10 g via ORAL
  Filled 2020-07-15: qty 1

## 2020-07-15 MED ORDER — ADULT MULTIVITAMIN W/MINERALS CH
1.0000 | ORAL_TABLET | Freq: Every day | ORAL | Status: DC
  Administered 2020-07-15 – 2020-07-21 (×5): 1 via ORAL
  Filled 2020-07-15 (×6): qty 1

## 2020-07-15 MED ORDER — INFLUENZA VAC SPLIT QUAD 0.5 ML IM SUSY
0.5000 mL | PREFILLED_SYRINGE | INTRAMUSCULAR | Status: DC
  Filled 2020-07-15: qty 0.5

## 2020-07-15 NOTE — Progress Notes (Signed)
CRITICAL VALUE ALERT  Critical Value: Hemoglobin of 6.5  Date & Time Notied: 07/15/20 0606  Provider Notified: Bedelia Person   Orders Received/Actions taken: MD to place order for blood
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
## 2020-07-16 ENCOUNTER — Inpatient Hospital Stay (HOSPITAL_COMMUNITY): Payer: Medicaid Other | Admitting: Anesthesiology

## 2020-07-16 ENCOUNTER — Inpatient Hospital Stay (HOSPITAL_COMMUNITY): Payer: Medicaid Other

## 2020-07-16 ENCOUNTER — Encounter (HOSPITAL_COMMUNITY): Admission: EM | Disposition: A | Discharge status: Home / Self Care | Payer: Self-pay | Source: Home / Self Care

## 2020-07-16 ENCOUNTER — Encounter (HOSPITAL_COMMUNITY): Payer: Self-pay | Admitting: Orthopaedic Surgery

## 2020-07-16 HISTORY — PX: FEMUR IM NAIL: SHX1597

## 2020-07-16 HISTORY — PX: ORIF PATELLA: SHX5033

## 2020-07-16 LAB — POCT I-STAT, CHEM 8
BUN: 11 mg/dL (ref 6–20)
Calcium, Ion: 1.12 mmol/L — ABNORMAL LOW (ref 1.15–1.40)
Chloride: 97 mmol/L — ABNORMAL LOW (ref 98–111)
Creatinine, Ser: 0.8 mg/dL (ref 0.61–1.24)
Glucose, Bld: 99 mg/dL (ref 70–99)
HCT: 26 % — ABNORMAL LOW (ref 39.0–52.0)
Hemoglobin: 8.8 g/dL — ABNORMAL LOW (ref 13.0–17.0)
Potassium: 4.4 mmol/L (ref 3.5–5.1)
Sodium: 137 mmol/L (ref 135–145)
TCO2: 28 mmol/L (ref 22–32)

## 2020-07-16 LAB — BASIC METABOLIC PANEL
Anion gap: 5 (ref 5–15)
BUN: 13 mg/dL (ref 6–20)
CO2: 29 mmol/L (ref 22–32)
Calcium: 7.8 mg/dL — ABNORMAL LOW (ref 8.9–10.3)
Chloride: 102 mmol/L (ref 98–111)
Creatinine, Ser: 0.93 mg/dL (ref 0.61–1.24)
GFR, Estimated: >60 mL/min (ref >60)
Glucose, Bld: 110 mg/dL — ABNORMAL HIGH (ref 70–99)
Potassium: 4.1 mmol/L (ref 3.5–5.1)
Sodium: 136 mmol/L (ref 135–145)

## 2020-07-16 LAB — CBC
HCT: 20.7 % — ABNORMAL LOW (ref 39.0–52.0)
Hemoglobin: 7.3 g/dL — ABNORMAL LOW (ref 13.0–17.0)
MCH: 29.6 pg (ref 26.0–34.0)
MCHC: 35.3 g/dL (ref 30.0–36.0)
MCV: 83.8 fL (ref 80.0–100.0)
Platelets: 72 10*3/uL — ABNORMAL LOW (ref 150–400)
RBC: 2.47 MIL/uL — ABNORMAL LOW (ref 4.22–5.81)
RDW: 13.2 % (ref 11.5–15.5)
WBC: 15.2 10*3/uL — ABNORMAL HIGH (ref 4.0–10.5)
nRBC: 0.1 % (ref 0.0–0.2)

## 2020-07-16 LAB — SURGICAL PCR SCREEN
MRSA, PCR: NEGATIVE
Staphylococcus aureus: NEGATIVE

## 2020-07-16 LAB — PREPARE RBC (CROSSMATCH)

## 2020-07-16 SURGERY — INSERTION, INTRAMEDULLARY ROD, FEMUR, RETROGRADE
Anesthesia: General | Site: Knee | Laterality: Right

## 2020-07-16 MED ORDER — ONDANSETRON HCL 4 MG/2ML IJ SOLN
INTRAMUSCULAR | Status: AC
  Filled 2020-07-16: qty 2

## 2020-07-16 MED ORDER — PHENYLEPHRINE 40 MCG/ML (10ML) SYRINGE FOR IV PUSH (FOR BLOOD PRESSURE SUPPORT)
PREFILLED_SYRINGE | INTRAVENOUS | Status: AC
  Filled 2020-07-16: qty 10

## 2020-07-16 MED ORDER — ROCURONIUM BROMIDE 10 MG/ML (PF) SYRINGE
PREFILLED_SYRINGE | INTRAVENOUS | Status: AC
  Filled 2020-07-16: qty 10

## 2020-07-16 MED ORDER — LIDOCAINE HCL (PF) 2 % IJ SOLN
INTRAMUSCULAR | Status: AC
  Filled 2020-07-16: qty 5

## 2020-07-16 MED ORDER — HYDROMORPHONE HCL 1 MG/ML IJ SOLN
0.2500 mg | INTRAMUSCULAR | Status: DC | PRN
  Administered 2020-07-16: 0.5 mg via INTRAVENOUS

## 2020-07-16 MED ORDER — FENTANYL CITRATE (PF) 250 MCG/5ML IJ SOLN
INTRAMUSCULAR | Status: AC
  Filled 2020-07-16: qty 5

## 2020-07-16 MED ORDER — FENTANYL CITRATE (PF) 100 MCG/2ML IJ SOLN: 25.0000 ug | INTRAMUSCULAR | Status: DC | PRN

## 2020-07-16 MED ORDER — OXYCODONE HCL 5 MG/5ML PO SOLN: 5.0000 mg | Freq: Once | ORAL | Status: DC | PRN

## 2020-07-16 MED ORDER — PROPOFOL 10 MG/ML IV BOLUS
INTRAVENOUS | Status: AC
  Filled 2020-07-16: qty 20

## 2020-07-16 MED ORDER — LIDOCAINE 2% (20 MG/ML) 5 ML SYRINGE
INTRAMUSCULAR | Status: DC | PRN
  Administered 2020-07-16: 50 mg via INTRAVENOUS

## 2020-07-16 MED ORDER — FENTANYL CITRATE (PF) 100 MCG/2ML IJ SOLN
INTRAMUSCULAR | Status: DC | PRN
  Administered 2020-07-16 (×5): 50 ug via INTRAVENOUS

## 2020-07-16 MED ORDER — 0.9 % SODIUM CHLORIDE (POUR BTL) OPTIME
TOPICAL | Status: DC | PRN
  Administered 2020-07-16: 1000 mL

## 2020-07-16 MED ORDER — VANCOMYCIN HCL 1000 MG IV SOLR
INTRAVENOUS | Status: DC | PRN
  Administered 2020-07-16: 1000 mg

## 2020-07-16 MED ORDER — DEXAMETHASONE SODIUM PHOSPHATE 10 MG/ML IJ SOLN
INTRAMUSCULAR | Status: DC | PRN
  Administered 2020-07-16: 10 mg via INTRAVENOUS

## 2020-07-16 MED ORDER — VANCOMYCIN HCL 1000 MG IV SOLR
INTRAVENOUS | Status: AC
  Filled 2020-07-16: qty 1000

## 2020-07-16 MED ORDER — SODIUM CHLORIDE 0.9 % IV SOLN: INTRAVENOUS | Status: DC

## 2020-07-16 MED ORDER — SUGAMMADEX SODIUM 200 MG/2ML IV SOLN
INTRAVENOUS | Status: DC | PRN
  Administered 2020-07-16: 180 mg via INTRAVENOUS

## 2020-07-16 MED ORDER — OXYCODONE HCL 5 MG PO TABS: 5.0000 mg | ORAL_TABLET | Freq: Once | ORAL | Status: DC | PRN

## 2020-07-16 MED ORDER — SODIUM CHLORIDE 0.9 % IV SOLN: INTRAVENOUS | Status: DC | PRN

## 2020-07-16 MED ORDER — PHENYLEPHRINE HCL (PRESSORS) 10 MG/ML IV SOLN
INTRAVENOUS | Status: DC | PRN
  Administered 2020-07-16: 80 ug via INTRAVENOUS
  Administered 2020-07-16: 40 ug via INTRAVENOUS
  Administered 2020-07-16: 80 ug via INTRAVENOUS

## 2020-07-16 MED ORDER — ACETAMINOPHEN 325 MG PO TABS: 325.0000 mg | ORAL_TABLET | ORAL | Status: DC | PRN

## 2020-07-16 MED ORDER — ACETAMINOPHEN 160 MG/5ML PO SOLN: 325.0000 mg | ORAL | Status: DC | PRN

## 2020-07-16 MED ORDER — PROPOFOL 10 MG/ML IV BOLUS
INTRAVENOUS | Status: DC | PRN
  Administered 2020-07-16: 200 mg via INTRAVENOUS

## 2020-07-16 MED ORDER — MEPERIDINE HCL 25 MG/ML IJ SOLN: 6.2500 mg | INTRAMUSCULAR | Status: DC | PRN

## 2020-07-16 MED ORDER — CEFAZOLIN SODIUM-DEXTROSE 2-4 GM/100ML-% IV SOLN
2.0000 g | Freq: Three times a day (TID) | INTRAVENOUS | Status: AC
  Administered 2020-07-16 – 2020-07-17 (×3): 2 g via INTRAVENOUS
  Filled 2020-07-16 (×3): qty 100

## 2020-07-16 MED ORDER — MIDAZOLAM HCL 2 MG/2ML IJ SOLN
INTRAMUSCULAR | Status: AC
  Filled 2020-07-16: qty 2

## 2020-07-16 MED ORDER — LACTATED RINGERS IV SOLN: INTRAVENOUS | Status: DC | PRN

## 2020-07-16 MED ORDER — ROCURONIUM BROMIDE 10 MG/ML (PF) SYRINGE
PREFILLED_SYRINGE | INTRAVENOUS | Status: DC | PRN
  Administered 2020-07-16: 50 mg via INTRAVENOUS
  Administered 2020-07-16: 20 mg via INTRAVENOUS

## 2020-07-16 MED ORDER — MIDAZOLAM HCL 5 MG/5ML IJ SOLN
INTRAMUSCULAR | Status: DC | PRN
  Administered 2020-07-16: 2 mg via INTRAVENOUS

## 2020-07-16 MED ORDER — HYDROMORPHONE HCL 1 MG/ML IJ SOLN
INTRAMUSCULAR | Status: AC
  Administered 2020-07-16: 0.5 mg via INTRAVENOUS
  Filled 2020-07-16: qty 1

## 2020-07-16 MED ORDER — ONDANSETRON HCL 4 MG/2ML IJ SOLN: 4.0000 mg | Freq: Once | INTRAMUSCULAR | Status: DC | PRN

## 2020-07-16 MED ORDER — SODIUM CHLORIDE 0.9% IV SOLUTION: Freq: Once | INTRAVENOUS | Status: DC

## 2020-07-16 MED ORDER — CEFAZOLIN SODIUM-DEXTROSE 2-3 GM-%(50ML) IV SOLR
INTRAVENOUS | Status: DC | PRN
  Administered 2020-07-16: 2 g via INTRAVENOUS

## 2020-07-16 MED ORDER — DEXAMETHASONE SODIUM PHOSPHATE 10 MG/ML IJ SOLN
INTRAMUSCULAR | Status: AC
  Filled 2020-07-16: qty 1

## 2020-07-16 MED ORDER — ONDANSETRON HCL 4 MG/2ML IJ SOLN
INTRAMUSCULAR | Status: DC | PRN
  Administered 2020-07-16: 4 mg via INTRAVENOUS

## 2020-07-16 SURGICAL SUPPLY — 104 items
ADH SKN CLS APL DERMABOND .7 (GAUZE/BANDAGES/DRESSINGS) ×2
APL PRP STRL LF DISP 70% ISPRP (MISCELLANEOUS) ×2
BANDAGE ESMARK 6X9 LF (GAUZE/BANDAGES/DRESSINGS) IMPLANT
BIT DRILL CALIBRATED 4.2 (BIT) IMPLANT
BIT DRILL QC 2X140 (BIT) ×2 IMPLANT
BIT DRILL SHORT 4.2 (BIT) IMPLANT
BLADE CLIPPER SURG (BLADE) ×4 IMPLANT
BNDG CMPR 9X6 STRL LF SNTH (GAUZE/BANDAGES/DRESSINGS)
BNDG CMPR MED 10X6 ELC LF (GAUZE/BANDAGES/DRESSINGS) ×2
BNDG COHESIVE 4X5 TAN STRL (GAUZE/BANDAGES/DRESSINGS) ×4 IMPLANT
BNDG ELASTIC 4X5.8 VLCR STR LF (GAUZE/BANDAGES/DRESSINGS) ×4 IMPLANT
BNDG ELASTIC 6X10 VLCR STRL LF (GAUZE/BANDAGES/DRESSINGS) ×2 IMPLANT
BNDG ELASTIC 6X5.8 VLCR STR LF (GAUZE/BANDAGES/DRESSINGS) ×4 IMPLANT
BNDG ESMARK 6X9 LF (GAUZE/BANDAGES/DRESSINGS)
BRUSH SCRUB EZ PLAIN DRY (MISCELLANEOUS) ×8 IMPLANT
CANISTER WOUND CARE 500ML ATS (WOUND CARE) ×2 IMPLANT
CHLORAPREP W/TINT 26 (MISCELLANEOUS) ×4 IMPLANT
COVER MAYO STAND STRL (DRAPES) ×4 IMPLANT
COVER SURGICAL LIGHT HANDLE (MISCELLANEOUS) ×8 IMPLANT
COVER TRANSDUCER CIVFLEX 5.5 (CAP) ×2 IMPLANT
COVER WAND RF STERILE (DRAPES) ×4 IMPLANT
CUFF TOURN SGL QUICK 34 (TOURNIQUET CUFF) ×4
CUFF TOURN SGL QUICK 42 (TOURNIQUET CUFF) IMPLANT
CUFF TRNQT CYL 34X4.125X (TOURNIQUET CUFF) ×2 IMPLANT
DECANTER SPIKE VIAL GLASS SM (MISCELLANEOUS) IMPLANT
DERMABOND ADVANCED (GAUZE/BANDAGES/DRESSINGS) ×2
DERMABOND ADVANCED .7 DNX12 (GAUZE/BANDAGES/DRESSINGS) ×2 IMPLANT
DRAPE C-ARM 42X72 X-RAY (DRAPES) ×4 IMPLANT
DRAPE C-ARMOR (DRAPES) ×4 IMPLANT
DRAPE HALF SHEET 40X57 (DRAPES) ×8 IMPLANT
DRAPE IMP U-DRAPE 54X76 (DRAPES) ×8 IMPLANT
DRAPE INCISE IOBAN 66X45 STRL (DRAPES) ×4 IMPLANT
DRAPE ORTHO SPLIT 77X108 STRL (DRAPES) ×8
DRAPE SURG 17X23 STRL (DRAPES) ×4 IMPLANT
DRAPE SURG ORHT 6 SPLT 77X108 (DRAPES) ×4 IMPLANT
DRAPE U-SHAPE 47X51 STRL (DRAPES) ×4 IMPLANT
DRILL BIT CALIBRATED 4.2 (BIT) ×4
DRILL BIT SHORT 4.2 (BIT) ×8
DRSG EMULSION OIL 3X3 NADH (GAUZE/BANDAGES/DRESSINGS) ×2 IMPLANT
DRSG MEPILEX BORDER 4X8 (GAUZE/BANDAGES/DRESSINGS) ×2 IMPLANT
DRSG VAC ATS LRG SENSATRAC (GAUZE/BANDAGES/DRESSINGS) ×2 IMPLANT
ELECT REM PT RETURN 9FT ADLT (ELECTROSURGICAL) ×4
ELECTRODE REM PT RTRN 9FT ADLT (ELECTROSURGICAL) ×2 IMPLANT
GAUZE SPONGE 4X4 12PLY STRL (GAUZE/BANDAGES/DRESSINGS) ×4 IMPLANT
GLOVE BIO SURGEON STRL SZ 6.5 (GLOVE) ×9 IMPLANT
GLOVE BIO SURGEON STRL SZ7.5 (GLOVE) ×16 IMPLANT
GLOVE BIO SURGEONS STRL SZ 6.5 (GLOVE) ×3
GLOVE BIOGEL PI IND STRL 6.5 (GLOVE) ×2 IMPLANT
GLOVE BIOGEL PI IND STRL 7.5 (GLOVE) ×2 IMPLANT
GLOVE BIOGEL PI INDICATOR 6.5 (GLOVE) ×2
GLOVE BIOGEL PI INDICATOR 7.5 (GLOVE) ×2
GOWN STRL REUS W/ TWL LRG LVL3 (GOWN DISPOSABLE) ×4 IMPLANT
GOWN STRL REUS W/TWL LRG LVL3 (GOWN DISPOSABLE) ×8
GUIDEWIRE 3.2X400 (WIRE) ×2 IMPLANT
IMMOBILIZER KNEE 22 UNIV (SOFTGOODS) ×4 IMPLANT
KIT BASIN OR (CUSTOM PROCEDURE TRAY) ×4 IMPLANT
KIT TEMPLATE PATELLA ANTERIOR (ORTHOPEDIC DISPOSABLE SUPPLIES) ×2 IMPLANT
KIT TURNOVER KIT B (KITS) ×4 IMPLANT
MANIFOLD NEPTUNE II (INSTRUMENTS) ×4 IMPLANT
NAIL CANN RFNA 10X420 5D (Nail) ×2 IMPLANT
NEEDLE 22X1 1/2 (OR ONLY) (NEEDLE) IMPLANT
NS IRRIG 1000ML POUR BTL (IV SOLUTION) ×4 IMPLANT
PACK GENERAL/GYN (CUSTOM PROCEDURE TRAY) ×4 IMPLANT
PACK ORTHO EXTREMITY (CUSTOM PROCEDURE TRAY) ×4 IMPLANT
PAD ARMBOARD 7.5X6 YLW CONV (MISCELLANEOUS) ×8 IMPLANT
PADDING CAST SYN 6 (CAST SUPPLIES) ×2
PADDING CAST SYNTHETIC 6X4 NS (CAST SUPPLIES) IMPLANT
PLATE LOCK SM 3H ANT PAT 2.7 S (Plate) ×2 IMPLANT
REAMER ROD DEEP FLUTE 2.5X950 (INSTRUMENTS) ×2 IMPLANT
RETRIEVER SUT HEWSON (MISCELLANEOUS) IMPLANT
SCREW LOCK IM 5X36 (Screw) ×4 IMPLANT
SCREW LOCK IM 5X60 (Screw) ×2 IMPLANT
SCREW LOCK IM 5X78 (Screw) ×2 IMPLANT
SCREW LOCK IM 5X86 (Screw) ×2 IMPLANT
SCREW LOCK IM TI 5X40 STRL (Screw) ×2 IMPLANT
SCREW LOCK VA ST 2.7X12 (Screw) ×2 IMPLANT
SCREW LOCK VA ST 2.7X14 (Screw) ×2 IMPLANT
SCREW LOCK VA ST 2.7X18 (Screw) ×8 IMPLANT
SCREW LOCK VA ST 2.7X20 (Screw) ×4 IMPLANT
SCREW LOCK X25 36X5X IM (Screw) IMPLANT
SCREW LOCKING 2.7X16MM VA (Screw) ×4 IMPLANT
SPONGE LAP 18X18 RF (DISPOSABLE) ×4 IMPLANT
STAPLER VISISTAT 35W (STAPLE) ×4 IMPLANT
SUT ETHILON 2 0 PSLX (SUTURE) ×6 IMPLANT
SUT ETHILON 3 0 PS 1 (SUTURE) ×4 IMPLANT
SUT FIBERWIRE #2 38 T-5 BLUE (SUTURE)
SUT FIBERWIRE #5 38 CONV NDL (SUTURE)
SUT MNCRL AB 3-0 PS2 18 (SUTURE) ×4 IMPLANT
SUT VIC AB 0 CT1 27 (SUTURE) ×4
SUT VIC AB 0 CT1 27XBRD ANBCTR (SUTURE) ×2 IMPLANT
SUT VIC AB 1 CT1 27 (SUTURE) ×4
SUT VIC AB 1 CT1 27XBRD ANBCTR (SUTURE) ×2 IMPLANT
SUT VIC AB 2-0 CT1 27 (SUTURE) ×4
SUT VIC AB 2-0 CT1 TAPERPNT 27 (SUTURE) ×2 IMPLANT
SUTURE FIBERWR #2 38 T-5 BLUE (SUTURE) IMPLANT
SUTURE FIBERWR #5 38 CONV NDL (SUTURE) IMPLANT
SYR CONTROL 10ML LL (SYRINGE) IMPLANT
TOWEL GREEN STERILE (TOWEL DISPOSABLE) ×8 IMPLANT
TOWEL GREEN STERILE FF (TOWEL DISPOSABLE) ×4 IMPLANT
TUBE CONNECTING 12'X1/4 (SUCTIONS) ×1
TUBE CONNECTING 12X1/4 (SUCTIONS) ×3 IMPLANT
UNDERPAD 30X36 HEAVY ABSORB (UNDERPADS AND DIAPERS) ×4 IMPLANT
WATER STERILE IRR 1000ML POUR (IV SOLUTION) ×4 IMPLANT
YANKAUER SUCT BULB TIP NO VENT (SUCTIONS) ×4 IMPLANT
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
## 2020-07-16 NOTE — Anesthesia Procedure Notes (Signed)
Procedure Name: Intubation Date/Time: 07/16/2020 7:48 AM Performed by: Inda Coke, CRNA Pre-anesthesia Checklist: Patient identified, Emergency Drugs available, Suction available and Patient being monitored Patient Re-evaluated:Patient Re-evaluated prior to induction Oxygen Delivery Method: Circle System Utilized Preoxygenation: Pre-oxygenation with 100% oxygen Induction Type: IV induction Ventilation: Mask ventilation without difficulty Laryngoscope Size: Mac and 4 Grade View: Grade I Tube type: Oral Tube size: 7.5 mm Number of attempts: 1 Airway Equipment and Method: Stylet and Oral airway Placement Confirmation: ETT inserted through vocal cords under direct vision,  positive ETCO2 and breath sounds checked- equal and bilateral Secured at: 22 cm Tube secured with: Tape Dental Injury: Teeth and Oropharynx as per pre-operative assessment

## 2020-07-16 NOTE — Progress Notes (Signed)
Patient received Oxycodone 10 mg oral PRN for severe pain for  his right leg at 9:51 pm. Patient's wife called within 20 minutes with the complaint  that the patient was  experiencing  excruciating pain and she was verbalizing lots of other complaints on the phone. This RN told the wife that the line was not clear and would like  to transfer the call  to my assigned  phone to see if it would give a better quality sound. Wife hanged up on me and  called the husband complaining that I was rude on the phone. She asked to speak to the supervisor and finally talked to the charge RN  with  the same complaint of me being rude. She voiced frustration that she wanted to stay and help the husband if needed and we drove her away at  8 pm. I have repositioned the patient's right  leg multiple times per their request. Patient  In addition received Dilaudid 0.5mg  IV PRN for severe pain. Now verbalizing his pain level to be 3/10 on the pain scale. Will continue to monitor.

## 2020-07-16 NOTE — Plan of Care (Signed)
  Problem: Nutrition: Goal: Adequate nutrition will be maintained Outcome: Progressing   

## 2020-07-16 NOTE — Anesthesia Preprocedure Evaluation (Addendum)
Anesthesia Evaluation  Patient identified by MRN, date of birth, ID band Patient awake    Reviewed: Allergy & Precautions, H&P , NPO status , Patient's Chart, lab work & pertinent test results  Airway Mallampati: I  TM Distance: >3 FB Neck ROM: Full    Dental no notable dental hx. (+) Teeth Intact, Dental Advisory Given   Pulmonary asthma , Current Smoker,    Pulmonary exam normal breath sounds clear to auscultation       Cardiovascular negative cardio ROS   Rhythm:Regular Rate:Normal     Neuro/Psych negative neurological ROS  negative psych ROS   GI/Hepatic negative GI ROS, Neg liver ROS,   Endo/Other  negative endocrine ROS  Renal/GU negative Renal ROS  negative genitourinary   Musculoskeletal   Abdominal   Peds  Hematology negative hematology ROS (+)   Anesthesia Other Findings   Reproductive/Obstetrics negative OB ROS                            Anesthesia Physical  Anesthesia Plan  ASA: II  Anesthesia Plan: General   Post-op Pain Management:    Induction: Intravenous  PONV Risk Score and Plan: 2 and Ondansetron, Dexamethasone and Midazolam  Airway Management Planned: Oral ETT, LMA and Nasal ETT  Additional Equipment:   Intra-op Plan:   Post-operative Plan: Extubation in OR  Informed Consent: I have reviewed the patients History and Physical, chart, labs and discussed the procedure including the risks, benefits and alternatives for the proposed anesthesia with the patient or authorized representative who has indicated his/her understanding and acceptance.     Dental advisory given  Plan Discussed with: CRNA and Anesthesiologist  Anesthesia Plan Comments:        Anesthesia Quick Evaluation
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
## 2020-07-16 NOTE — Anesthesia Postprocedure Evaluation (Signed)
Anesthesia Post Note  Patient: David Spears  Procedure(s) Performed: INTRAMEDULLARY (IM) RETROGRADE FEMORAL NAILING (Right ) OPEN REDUCTION INTERNAL (ORIF) FIXATION PATELLA (Right Knee)     Patient location during evaluation: PACU Anesthesia Type: General Level of consciousness: awake and alert Pain management: pain level controlled Vital Signs Assessment: post-procedure vital signs reviewed and stable Respiratory status: spontaneous breathing, nonlabored ventilation, respiratory function stable and patient connected to nasal cannula oxygen Cardiovascular status: blood pressure returned to baseline and stable Postop Assessment: no apparent nausea or vomiting Anesthetic complications: no   No complications documented.  Last Vitals:  Vitals:   07/16/20 1105 07/16/20 1123  BP: 136/88 137/66  Pulse: 64 73  Resp: 13 16  Temp:  36.8 C  SpO2: 100% 99%    Last Pain:  Vitals:   07/16/20 1105  TempSrc:   PainSc: Asleep                 Branae Crail

## 2020-07-16 NOTE — Progress Notes (Signed)
  Progress Note    07/16/2020 12:04 PM Day of Surgery  Subjective:  Post-op pain. Just back from repair of right femoral shaft fracture   Vitals:   07/16/20 1105 07/16/20 1123  BP: 136/88 137/66  Pulse: 64 73  Resp: 13 16  Temp:  98.2 F (36.8 C)  SpO2: 100% 99%    Physical Exam: Cardiac:  RRR Lungs:  Non-labored resp Incisions:  Left thigh incisions dressed: dry and intact Extremities:  Ace wrap in place from toes to prox right thigh. He can wiggle toes and has intact sensation. 2+ palp DP pulses bilaterally Awake, alert in NAD  CBC    Component Value Date/Time   WBC 15.2 (H) 07/16/2020 0153   RBC 2.47 (L) 07/16/2020 0153   HGB 8.8 (L) 07/16/2020 0948   HCT 26.0 (L) 07/16/2020 0948   PLT 72 (L) 07/16/2020 0153   MCV 83.8 07/16/2020 0153   MCH 29.6 07/16/2020 0153   MCHC 35.3 07/16/2020 0153   RDW 13.2 07/16/2020 0153    BMET    Component Value Date/Time   NA 137 07/16/2020 0948   K 4.4 07/16/2020 0948   CL 97 (L) 07/16/2020 0948   CO2 29 07/16/2020 0153   GLUCOSE 99 07/16/2020 0948   BUN 11 07/16/2020 0948   CREATININE 0.80 07/16/2020 0948   CALCIUM 7.8 (L) 07/16/2020 0153   GFRNONAA >60 07/16/2020 0153     Intake/Output Summary (Last 24 hours) at 07/16/2020 1204 Last data filed at 07/16/2020 1100 Gross per 24 hour  Intake 3920.66 ml  Output 5800 ml  Net -1879.34 ml    HOSPITAL MEDICATIONS Scheduled Meds: . sodium chloride   Intravenous Once  . acetaminophen  1,000 mg Oral Q6H  . aspirin EC  81 mg Oral Daily  . Chlorhexidine Gluconate Cloth  6 each Topical Daily  . docusate sodium  100 mg Oral BID  . folic acid  1 mg Oral Daily  . gabapentin  300 mg Oral TID  . influenza vac split quadrivalent PF  0.5 mL Intramuscular Tomorrow-1000  . methocarbamol  1,000 mg Oral Q8H  . multivitamin with minerals  1 tablet Oral Daily  . thiamine  100 mg Oral Daily   Continuous Infusions: . sodium chloride    .  ceFAZolin (ANCEF) IV     PRN  Meds:.diphenhydrAMINE **OR** diphenhydrAMINE, HYDROmorphone (DILAUDID) injection, ondansetron **OR** ondansetron (ZOFRAN) IV, oxyCODONE, prochlorperazine **OR** prochlorperazine  Assessment:Patient is now immediately post-op retrograde intramedullary nailing of segmental right femur fracture, open reduction internal fixation of right patella fracture, removal of external fixation right leg, secondary closure (partial) of right lower leg fasciotomy wounds and wound vac placement to right leg   POD 2  right distal superficial femoral artery to below-knee popliteal artery bypass with nonreversed translocated saphenous vein graft, harvesting of left great saphenous vein,4 compartment fasciotomies and placement of bilateral VAC's  VSS. Right lower extremity well perfused  Plan: -As per ortho service: "Continue with the wound VAC until Wednesday when we will plan to return for possible fasciotomy closure.  He may mobilize with physical and occupational therapy until then." -DVT prophylaxis:  Lovenox   Wendi Maya, PA-C [facility] [phone] 07/16/2020  12:04 PM
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
## 2020-07-16 NOTE — Transfer of Care (Signed)
Immediate Anesthesia Transfer of Care Note  Patient: Elihue Ebert  Procedure(s) Performed: INTRAMEDULLARY (IM) RETROGRADE FEMORAL NAILING (Right ) OPEN REDUCTION INTERNAL (ORIF) FIXATION PATELLA (Right Knee)  Patient Location: PACU  Anesthesia Type:General  Level of Consciousness: awake, alert  and oriented  Airway & Oxygen Therapy: Patient Spontanous Breathing and Patient connected to nasal cannula oxygen  Post-op Assessment: Report given to RN and Post -op Vital signs reviewed and stable  Post vital signs: Reviewed and stable  Last Vitals:  Vitals Value Taken Time  BP 126/83 07/16/20 1023  Temp    Pulse 73 07/16/20 1024  Resp 12 07/16/20 1024  SpO2 100 % 07/16/20 1024  Vitals shown include unvalidated device data.  Last Pain:  Vitals:   07/16/20 0353  TempSrc:   PainSc: Asleep      Patients Stated Pain Goal: 1 (07/14/20 1600)  Complications: No complications documented.

## 2020-07-16 NOTE — TOC CAGE-AID Note (Signed)
Transition of Care [facility]) - CAGE-AID Screening   Patient Details  Name: David Spears MRN: [id number] Date of Birth: 26-Oct-1990  [facility]) CM/SW Contact:    Emeterio Reeve, [place] Phone Number: 07/16/2020, 4:32 PM   Clinical Narrative:  CSW met with pt at bedside. CSW introduced self and explained role at the hospital.  Pt denies alcohol use. Pt denies substance use. Pt did not need any resources at this time.    CAGE-AID Screening:    Have You Ever Felt You Ought to Cut Down on Your Drinking or Drug Use?: No Have People Annoyed You By Critizing Your Drinking Or Drug Use?: No Have You Felt Bad Or Guilty About Your Drinking Or Drug Use?: No Have You Ever Had a Drink or Used Drugs First Thing In The Morning to Steady Your Nerves or to Get Rid of a Hangover?: No CAGE-AID Score: 0  Substance Abuse Education Offered: Yes    Blima Ledger, [place] Social Worker [phone]
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
## 2020-07-17 LAB — BPAM RBC
Blood Product Expiration Date: 202112122359
Blood Product Expiration Date: 202112122359
Blood Product Expiration Date: 202112122359
Blood Product Expiration Date: 202112142359
Blood Product Expiration Date: 202112162359
Blood Product Expiration Date: 202112242359
Blood Product Expiration Date: 202112242359
ISSUE DATE / TIME: 202111200634
ISSUE DATE / TIME: 202111200634
ISSUE DATE / TIME: 202111210743
ISSUE DATE / TIME: 202111211431
ISSUE DATE / TIME: 202111212058
ISSUE DATE / TIME: 202111220812
ISSUE DATE / TIME: 202111220812
Unit Type and Rh: 6200
Unit Type and Rh: 6200
Unit Type and Rh: 6200
Unit Type and Rh: 6200
Unit Type and Rh: 6200
Unit Type and Rh: 6200
Unit Type and Rh: 6200

## 2020-07-17 LAB — TYPE AND SCREEN
ABO/RH(D): A POS
Antibody Screen: NEGATIVE
Unit division: 0
Unit division: 0
Unit division: 0
Unit division: 0
Unit division: 0
Unit division: 0
Unit division: 0

## 2020-07-17 LAB — CBC
HCT: 22.3 % — ABNORMAL LOW (ref 39.0–52.0)
Hemoglobin: 7.6 g/dL — ABNORMAL LOW (ref 13.0–17.0)
MCH: 29.1 pg (ref 26.0–34.0)
MCHC: 34.1 g/dL (ref 30.0–36.0)
MCV: 85.4 fL (ref 80.0–100.0)
Platelets: 83 10*3/uL — ABNORMAL LOW (ref 150–400)
RBC: 2.61 MIL/uL — ABNORMAL LOW (ref 4.22–5.81)
RDW: 13.5 % (ref 11.5–15.5)
WBC: 14.2 10*3/uL — ABNORMAL HIGH (ref 4.0–10.5)
nRBC: 0.1 % (ref 0.0–0.2)

## 2020-07-17 LAB — BASIC METABOLIC PANEL
Anion gap: 5 (ref 5–15)
BUN: <5 mg/dL — ABNORMAL LOW (ref 6–20)
CO2: 30 mmol/L (ref 22–32)
Calcium: 7.9 mg/dL — ABNORMAL LOW (ref 8.9–10.3)
Chloride: 99 mmol/L (ref 98–111)
Creatinine, Ser: 0.85 mg/dL (ref 0.61–1.24)
GFR, Estimated: >60 mL/min (ref >60)
Glucose, Bld: 115 mg/dL — ABNORMAL HIGH (ref 70–99)
Potassium: 3.9 mmol/L (ref 3.5–5.1)
Sodium: 134 mmol/L — ABNORMAL LOW (ref 135–145)

## 2020-07-17 LAB — VITAMIN D 25 HYDROXY (VIT D DEFICIENCY, FRACTURES): Vit D, 25-Hydroxy: 15.33 ng/mL — ABNORMAL LOW (ref 30–100)

## 2020-07-17 MED ORDER — OXYCODONE HCL 5 MG PO TABS
5.0000 mg | ORAL_TABLET | ORAL | Status: DC | PRN
  Administered 2020-07-19: 10 mg via ORAL
  Filled 2020-07-17: qty 2

## 2020-07-17 MED ORDER — GABAPENTIN 300 MG PO CAPS
600.0000 mg | ORAL_CAPSULE | Freq: Three times a day (TID) | ORAL | Status: DC
  Administered 2020-07-17 – 2020-07-19 (×7): 600 mg via ORAL
  Filled 2020-07-17 (×7): qty 2

## 2020-07-17 MED ORDER — WHITE PETROLATUM EX OINT
TOPICAL_OINTMENT | CUTANEOUS | Status: AC
  Administered 2020-07-17: 0.2
  Filled 2020-07-17: qty 28.35

## 2020-07-17 MED ORDER — KETOROLAC TROMETHAMINE 15 MG/ML IJ SOLN
30.0000 mg | Freq: Four times a day (QID) | INTRAMUSCULAR | Status: DC
  Administered 2020-07-17 – 2020-07-19 (×7): 30 mg via INTRAVENOUS
  Filled 2020-07-17 (×7): qty 2

## 2020-07-17 MED ORDER — HYDROMORPHONE HCL 1 MG/ML IJ SOLN
0.5000 mg | INTRAMUSCULAR | Status: DC | PRN
Start: 2020-07-17 — End: 2020-07-17

## 2020-07-17 MED ORDER — HYDROMORPHONE HCL 1 MG/ML IJ SOLN
0.5000 mg | INTRAMUSCULAR | Status: DC | PRN
  Administered 2020-07-17 – 2020-07-18 (×5): 0.5 mg via INTRAVENOUS
  Filled 2020-07-17 (×5): qty 1

## 2020-07-17 MED ORDER — OXYCODONE HCL 5 MG PO TABS
10.0000 mg | ORAL_TABLET | ORAL | Status: DC | PRN
  Administered 2020-07-17 – 2020-07-18 (×5): 15 mg via ORAL
  Filled 2020-07-17 (×5): qty 3

## 2020-07-17 MED ORDER — CEFAZOLIN SODIUM-DEXTROSE 2-4 GM/100ML-% IV SOLN
2.0000 g | INTRAVENOUS | Status: AC
  Administered 2020-07-18: 2 g via INTRAVENOUS
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
## 2020-07-17 NOTE — Progress Notes (Signed)
Patient slept most part of the night without requesting for additiona PRN pain medication. No acute distress noted. Will continue to monitor.

## 2020-07-17 NOTE — Progress Notes (Signed)
Patient scratching at sutures, 1 staple scratched out of left leg, minimal bleeding noted. Right leg incision, minimal bleeding due to scratching, no sutures removed. Patient given benadryl and encouraged to not scratch but pat if needed. Sutures covered with abd pad and soft tape to prevent scratching.
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
## 2020-07-17 NOTE — Anesthesia Postprocedure Evaluation (Signed)
Anesthesia Post Note  Patient: David Spears  Procedure(s) Performed: ARTERY EXPLORATION AND REPAIR, Right LEG (Right Thigh) EXTERNAL FIXATION LEG (Right Thigh) Right Above the Knee artery bypass to below the knee Popliteal artery  BYPASS GRAFT using Saphenous vein graft from Left leg. (Right Leg Upper) APPLICATION OF WOUND VAC times two to right lower leg. (Right Leg Lower) Four Compartment FASCIOTOMY (Right Leg Lower)     Patient location during evaluation: PACU Anesthesia Type: General Level of consciousness: awake and alert Pain management: pain level controlled Vital Signs Assessment: post-procedure vital signs reviewed and stable Respiratory status: spontaneous breathing, nonlabored ventilation, respiratory function stable and patient connected to nasal cannula oxygen Cardiovascular status: blood pressure returned to baseline and stable Postop Assessment: no apparent nausea or vomiting Anesthetic complications: no   No complications documented.  Last Vitals:  Vitals:   07/17/20 1611 07/17/20 2003  BP: (!) 121/57 124/60  Pulse: 99 95  Resp: 15   Temp: 37.1 C 36.9 C  SpO2: 98%     Last Pain:  Vitals:   07/17/20 1838  TempSrc:   PainSc: 4                  Matther Labell S
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
## 2020-07-18 ENCOUNTER — Encounter (HOSPITAL_COMMUNITY): Payer: Self-pay | Admitting: Surgery

## 2020-07-18 ENCOUNTER — Encounter (HOSPITAL_COMMUNITY): Admission: EM | Disposition: A | Discharge status: Home / Self Care | Payer: Self-pay | Source: Home / Self Care

## 2020-07-18 ENCOUNTER — Inpatient Hospital Stay (HOSPITAL_COMMUNITY): Payer: Medicaid Other | Admitting: Anesthesiology

## 2020-07-18 DIAGNOSIS — S72361B Displaced segmental fracture of shaft of right femur, initial encounter for open fracture type I or II: Secondary | ICD-10-CM

## 2020-07-18 DIAGNOSIS — S82044A Nondisplaced comminuted fracture of right patella, initial encounter for closed fracture: Secondary | ICD-10-CM

## 2020-07-18 HISTORY — PX: SECONDARY CLOSURE OF WOUND: SHX6208

## 2020-07-18 LAB — CBC
HCT: 20.2 % — ABNORMAL LOW (ref 39.0–52.0)
Hemoglobin: 6.7 g/dL — CL (ref 13.0–17.0)
MCH: 29.3 pg (ref 26.0–34.0)
MCHC: 33.2 g/dL (ref 30.0–36.0)
MCV: 88.2 fL (ref 80.0–100.0)
Platelets: 121 10*3/uL — ABNORMAL LOW (ref 150–400)
RBC: 2.29 MIL/uL — ABNORMAL LOW (ref 4.22–5.81)
RDW: 13.4 % (ref 11.5–15.5)
WBC: 11.2 10*3/uL — ABNORMAL HIGH (ref 4.0–10.5)
nRBC: 0.4 % — ABNORMAL HIGH (ref 0.0–0.2)

## 2020-07-18 LAB — HEMOGLOBIN AND HEMATOCRIT, BLOOD
HCT: 22.9 % — ABNORMAL LOW (ref 39.0–52.0)
Hemoglobin: 7.6 g/dL — ABNORMAL LOW (ref 13.0–17.0)

## 2020-07-18 LAB — PREPARE RBC (CROSSMATCH)

## 2020-07-18 LAB — BASIC METABOLIC PANEL
Anion gap: 7 (ref 5–15)
BUN: 6 mg/dL (ref 6–20)
CO2: 29 mmol/L (ref 22–32)
Calcium: 8 mg/dL — ABNORMAL LOW (ref 8.9–10.3)
Chloride: 99 mmol/L (ref 98–111)
Creatinine, Ser: 0.83 mg/dL (ref 0.61–1.24)
GFR, Estimated: >60 mL/min (ref >60)
Glucose, Bld: 107 mg/dL — ABNORMAL HIGH (ref 70–99)
Potassium: 4.5 mmol/L (ref 3.5–5.1)
Sodium: 135 mmol/L (ref 135–145)

## 2020-07-18 SURGERY — SECONDARY CLOSURE OF WOUND
Anesthesia: General | Laterality: Right

## 2020-07-18 MED ORDER — PHENYLEPHRINE 40 MCG/ML (10ML) SYRINGE FOR IV PUSH (FOR BLOOD PRESSURE SUPPORT)
PREFILLED_SYRINGE | INTRAVENOUS | Status: AC
  Filled 2020-07-18: qty 10

## 2020-07-18 MED ORDER — FENTANYL CITRATE (PF) 250 MCG/5ML IJ SOLN
INTRAMUSCULAR | Status: AC
  Filled 2020-07-18: qty 5

## 2020-07-18 MED ORDER — SODIUM CHLORIDE (PF) 0.9 % IJ SOLN
INTRAMUSCULAR | Status: AC
  Filled 2020-07-18: qty 10

## 2020-07-18 MED ORDER — SODIUM CHLORIDE 0.9 % IV SOLN: INTRAVENOUS | Status: DC | PRN

## 2020-07-18 MED ORDER — LIDOCAINE HCL (PF) 2 % IJ SOLN
INTRAMUSCULAR | Status: AC
  Filled 2020-07-18: qty 5

## 2020-07-18 MED ORDER — PROMETHAZINE HCL 25 MG/ML IJ SOLN: 6.2500 mg | INTRAMUSCULAR | Status: DC | PRN

## 2020-07-18 MED ORDER — FENTANYL CITRATE (PF) 250 MCG/5ML IJ SOLN
INTRAMUSCULAR | Status: DC | PRN
  Administered 2020-07-18 (×2): 50 ug via INTRAVENOUS
  Administered 2020-07-18 (×2): 25 ug via INTRAVENOUS
  Administered 2020-07-18 (×2): 50 ug via INTRAVENOUS

## 2020-07-18 MED ORDER — MIDAZOLAM HCL 5 MG/5ML IJ SOLN
INTRAMUSCULAR | Status: DC | PRN
  Administered 2020-07-18: 2 mg via INTRAVENOUS

## 2020-07-18 MED ORDER — SODIUM CHLORIDE 0.9% IV SOLUTION: Freq: Once | INTRAVENOUS | Status: DC

## 2020-07-18 MED ORDER — FENTANYL CITRATE (PF) 100 MCG/2ML IJ SOLN: 25.0000 ug | INTRAMUSCULAR | Status: DC | PRN

## 2020-07-18 MED ORDER — CEFAZOLIN SODIUM-DEXTROSE 2-4 GM/100ML-% IV SOLN
2.0000 g | Freq: Three times a day (TID) | INTRAVENOUS | Status: AC
  Administered 2020-07-18 – 2020-07-19 (×3): 2 g via INTRAVENOUS
  Filled 2020-07-18 (×3): qty 100

## 2020-07-18 MED ORDER — PROPOFOL 10 MG/ML IV BOLUS
INTRAVENOUS | Status: DC | PRN
  Administered 2020-07-18: 200 mg via INTRAVENOUS

## 2020-07-18 MED ORDER — PHENYLEPHRINE 40 MCG/ML (10ML) SYRINGE FOR IV PUSH (FOR BLOOD PRESSURE SUPPORT)
PREFILLED_SYRINGE | INTRAVENOUS | Status: DC | PRN
  Administered 2020-07-18: 80 ug via INTRAVENOUS
  Administered 2020-07-18: 120 ug via INTRAVENOUS

## 2020-07-18 MED ORDER — VANCOMYCIN HCL 1000 MG IV SOLR
INTRAVENOUS | Status: DC | PRN
  Administered 2020-07-18: 1000 mg

## 2020-07-18 MED ORDER — OXYCODONE HCL 5 MG/5ML PO SOLN: 5.0000 mg | Freq: Once | ORAL | Status: DC | PRN

## 2020-07-18 MED ORDER — MIDAZOLAM HCL 2 MG/2ML IJ SOLN
INTRAMUSCULAR | Status: AC
  Filled 2020-07-18: qty 2

## 2020-07-18 MED ORDER — 0.9 % SODIUM CHLORIDE (POUR BTL) OPTIME
TOPICAL | Status: DC | PRN
  Administered 2020-07-18: 1000 mL

## 2020-07-18 MED ORDER — DEXAMETHASONE SODIUM PHOSPHATE 10 MG/ML IJ SOLN
INTRAMUSCULAR | Status: AC
  Filled 2020-07-18: qty 1

## 2020-07-18 MED ORDER — LACTATED RINGERS IV SOLN: INTRAVENOUS | Status: DC | PRN

## 2020-07-18 MED ORDER — PROPOFOL 10 MG/ML IV BOLUS
INTRAVENOUS | Status: AC
  Filled 2020-07-18: qty 40

## 2020-07-18 MED ORDER — OXYCODONE HCL 5 MG PO TABS: 5.0000 mg | ORAL_TABLET | Freq: Once | ORAL | Status: DC | PRN

## 2020-07-18 MED ORDER — ONDANSETRON HCL 4 MG/2ML IJ SOLN
INTRAMUSCULAR | Status: DC | PRN
  Administered 2020-07-18: 4 mg via INTRAVENOUS

## 2020-07-18 MED ORDER — CEFAZOLIN SODIUM 1 G IJ SOLR
INTRAMUSCULAR | Status: AC
  Filled 2020-07-18: qty 20

## 2020-07-18 MED ORDER — ROCURONIUM BROMIDE 10 MG/ML (PF) SYRINGE
PREFILLED_SYRINGE | INTRAVENOUS | Status: AC
  Filled 2020-07-18: qty 10

## 2020-07-18 MED ORDER — DEXAMETHASONE SODIUM PHOSPHATE 10 MG/ML IJ SOLN
INTRAMUSCULAR | Status: DC | PRN
  Administered 2020-07-18: 10 mg via INTRAVENOUS

## 2020-07-18 MED ORDER — LIDOCAINE 2% (20 MG/ML) 5 ML SYRINGE
INTRAMUSCULAR | Status: DC | PRN
  Administered 2020-07-18: 60 mg via INTRAVENOUS

## 2020-07-18 MED ORDER — VANCOMYCIN HCL 1000 MG IV SOLR
INTRAVENOUS | Status: AC
  Filled 2020-07-18: qty 1000

## 2020-07-18 MED ORDER — ONDANSETRON HCL 4 MG/2ML IJ SOLN
INTRAMUSCULAR | Status: AC
  Filled 2020-07-18: qty 2

## 2020-07-18 SURGICAL SUPPLY — 55 items
APL PRP STRL LF DISP 70% ISPRP (MISCELLANEOUS) ×1
BLADE SURG 10 STRL SS (BLADE) ×3 IMPLANT
BNDG CMPR MED 10X6 ELC LF (GAUZE/BANDAGES/DRESSINGS) ×1
BNDG COHESIVE 4X5 TAN STRL (GAUZE/BANDAGES/DRESSINGS) ×3 IMPLANT
BNDG ELASTIC 6X10 VLCR STRL LF (GAUZE/BANDAGES/DRESSINGS) ×2 IMPLANT
BNDG GAUZE ELAST 4 BULKY (GAUZE/BANDAGES/DRESSINGS) ×15 IMPLANT
BRUSH SCRUB EZ PLAIN DRY (MISCELLANEOUS) ×6 IMPLANT
CANISTER WOUNDNEG PRESSURE 500 (CANNISTER) ×2 IMPLANT
CHLORAPREP W/TINT 26 (MISCELLANEOUS) ×3 IMPLANT
COVER SURGICAL LIGHT HANDLE (MISCELLANEOUS) ×6 IMPLANT
COVER WAND RF STERILE (DRAPES) ×3 IMPLANT
DRAPE C-ARMOR (DRAPES) ×3 IMPLANT
DRAPE U-SHAPE 47X51 STRL (DRAPES) ×3 IMPLANT
DRSG ADAPTIC 3X8 NADH LF (GAUZE/BANDAGES/DRESSINGS) ×3 IMPLANT
DRSG MEPILEX BORDER 4X4 (GAUZE/BANDAGES/DRESSINGS) ×4 IMPLANT
DRSG MEPITEL 4X7.2 (GAUZE/BANDAGES/DRESSINGS) ×2 IMPLANT
DRSG MEPITEL 8X12 (GAUZE/BANDAGES/DRESSINGS) ×1 IMPLANT
DRSG VAC ATS MED SENSATRAC (GAUZE/BANDAGES/DRESSINGS) ×2 IMPLANT
ELECT CAUTERY BLADE 6.4 (BLADE) IMPLANT
ELECT REM PT RETURN 9FT ADLT (ELECTROSURGICAL)
ELECTRODE REM PT RTRN 9FT ADLT (ELECTROSURGICAL) IMPLANT
GAUZE SPONGE 4X4 12PLY STRL (GAUZE/BANDAGES/DRESSINGS) ×3 IMPLANT
GLOVE BIO SURGEON STRL SZ 6.5 (GLOVE) ×6 IMPLANT
GLOVE BIO SURGEON STRL SZ7.5 (GLOVE) ×12 IMPLANT
GLOVE BIO SURGEONS STRL SZ 6.5 (GLOVE) ×3
GLOVE BIOGEL PI IND STRL 6.5 (GLOVE) ×1 IMPLANT
GLOVE BIOGEL PI IND STRL 7.5 (GLOVE) ×1 IMPLANT
GLOVE BIOGEL PI INDICATOR 6.5 (GLOVE) ×2
GLOVE BIOGEL PI INDICATOR 7.5 (GLOVE) ×2
GOWN STRL REUS W/ TWL LRG LVL3 (GOWN DISPOSABLE) ×2 IMPLANT
GOWN STRL REUS W/TWL LRG LVL3 (GOWN DISPOSABLE) ×6
HANDPIECE INTERPULSE COAX TIP (DISPOSABLE)
KIT BASIN OR (CUSTOM PROCEDURE TRAY) ×3 IMPLANT
KIT TURNOVER KIT B (KITS) ×3 IMPLANT
MANIFOLD NEPTUNE II (INSTRUMENTS) ×3 IMPLANT
MEPITEL 8X12 (GAUZE/BANDAGES/DRESSINGS) ×1
NS IRRIG 1000ML POUR BTL (IV SOLUTION) ×3 IMPLANT
PACK ORTHO EXTREMITY (CUSTOM PROCEDURE TRAY) ×3 IMPLANT
PAD ARMBOARD 7.5X6 YLW CONV (MISCELLANEOUS) ×6 IMPLANT
PADDING CAST ABS 6INX4YD NS (CAST SUPPLIES) ×4
PADDING CAST ABS COTTON 6X4 NS (CAST SUPPLIES) IMPLANT
PADDING CAST COTTON 6X4 STRL (CAST SUPPLIES) ×3 IMPLANT
SET HNDPC FAN SPRY TIP SCT (DISPOSABLE) IMPLANT
SPONGE LAP 18X18 RF (DISPOSABLE) ×3 IMPLANT
STOCKINETTE IMPERVIOUS 9X36 MD (GAUZE/BANDAGES/DRESSINGS) ×3 IMPLANT
SUT ETHILON 2 0 PSLX (SUTURE) ×10 IMPLANT
SUT PDS AB 2-0 CT1 27 (SUTURE) IMPLANT
SWAB CULTURE ESWAB REG 1ML (MISCELLANEOUS) IMPLANT
TOWEL GREEN STERILE (TOWEL DISPOSABLE) ×6 IMPLANT
TOWEL GREEN STERILE FF (TOWEL DISPOSABLE) ×3 IMPLANT
TUBE CONNECTING 12'X1/4 (SUCTIONS) ×1
TUBE CONNECTING 12X1/4 (SUCTIONS) ×2 IMPLANT
UNDERPAD 30X36 HEAVY ABSORB (UNDERPADS AND DIAPERS) ×3 IMPLANT
WATER STERILE IRR 1000ML POUR (IV SOLUTION) ×3 IMPLANT
YANKAUER SUCT BULB TIP NO VENT (SUCTIONS) ×3 IMPLANT

## 2020-07-18 NOTE — Discharge Instructions (Signed)
[facility] Discharge Instructions   General Discharge Instructions  WEIGHT BEARING STATUS: Non-weightbearing right leg  RANGE OF MOTION/ACTIVITY: Ok for hip, knee, and ankle motion as tolerated  Wound Care:  Incisions can be left open to air if there is no drainage. If incision continues to have drainage, follow wound care instructions below. Okay to shower if no drainage from incisions.  DVT/PE prophylaxis: Aspirin 325 mg BID  Diet: as you were eating previously.  Can use over the counter stool softeners and bowel preparations, such as Miralax, to help with bowel movements.  Narcotics can be constipating.  Be sure to drink plenty of fluids  PAIN MEDICATION USE AND EXPECTATIONS  You have likely been given narcotic medications to help control your pain.  After a traumatic event that results in an fracture (broken bone) with or without surgery, it is ok to use narcotic pain medications to help control one's pain.  We understand that everyone responds to pain differently and each individual patient will be evaluated on a regular basis for the continued need for narcotic medications. Ideally, narcotic medication use should last no more than 6-8 weeks (coinciding with fracture healing).   As a patient it is your responsibility as well to monitor narcotic medication use and report the amount and frequency you use these medications when you come to your office visit.   We would also advise that if you are using narcotic medications, you should take a dose prior to therapy to maximize you participation.  IF YOU ARE ON NARCOTIC MEDICATIONS IT IS NOT PERMISSIBLE TO OPERATE A MOTOR VEHICLE (MOTORCYCLE/CAR/TRUCK/MOPED) OR HEAVY MACHINERY DO NOT MIX NARCOTICS WITH OTHER CNS (CENTRAL NERVOUS SYSTEM) DEPRESSANTS SUCH AS ALCOHOL   STOP SMOKING OR USING NICOTINE PRODUCTS!!!!  As discussed nicotine severely impairs your body's ability to heal surgical and traumatic wounds but also impairs  bone healing.  Wounds and bone heal by forming microscopic blood vessels (angiogenesis) and nicotine is a vasoconstrictor (essentially, shrinks blood vessels).  Therefore, if vasoconstriction occurs to these microscopic blood vessels they essentially disappear and are unable to deliver necessary nutrients to the healing tissue.  This is one modifiable factor that you can do to dramatically increase your chances of healing your injury.    (This means no smoking, no nicotine gum, patches, etc)  DO NOT USE NONSTEROIDAL ANTI-INFLAMMATORY DRUGS (NSAID'S)  Using products such as Advil (ibuprofen), Aleve (naproxen), Motrin (ibuprofen) for additional pain control during fracture healing can delay and/or prevent the healing response.  If you would like to take over the counter (OTC) medication, Tylenol (acetaminophen) is ok.  However, some narcotic medications that are given for pain control contain acetaminophen as well. Therefore, you should not exceed more than 4000 mg of tylenol in a day if you do not have liver disease.  Also note that there are may OTC medicines, such as cold medicines and allergy medicines that my contain tylenol as well.  If you have any questions about medications and/or interactions please ask your doctor/PA or your pharmacist.      ICE AND ELEVATE INJURED/OPERATIVE EXTREMITY  Using ice and elevating the injured extremity above your heart can help with swelling and pain control.  Icing in a pulsatile fashion, such as 20 minutes on and 20 minutes off, can be followed.    Do not place ice directly on skin. Make sure there is a barrier between to skin and the ice pack.    Using frozen items such as frozen  peas works well as the conform nicely to the are that needs to be iced.  USE AN ACE WRAP OR TED HOSE FOR SWELLING CONTROL  In addition to icing and elevation, Ace wraps or TED hose are used to help limit and resolve swelling.  It is recommended to use Ace wraps or TED hose until you are  informed to stop.    When using Ace Wraps start the wrapping distally (farthest away from the body) and wrap proximally (closer to the body)   Example: If you had surgery on your leg or thing and you do not have a splint on, start the ace wrap at the toes and work your way up to the thigh        If you had surgery on your upper extremity and do not have a splint on, start the ace wrap at your fingers and work your way up to the upper arm   CALL THE OFFICE WITH ANY QUESTIONS OR CONCERNS: [phone]   VISIT OUR WEBSITE FOR ADDITIONAL INFORMATION: [email or web address]   Discharge Wound Care Instructions  Do NOT apply any ointments, solutions or lotions to pin sites or surgical wounds.  These prevent needed drainage and even though solutions like hydrogen peroxide kill bacteria, they also damage cells lining the pin sites that help fight infection.  Applying lotions or ointments can keep the wounds moist and can cause them to breakdown and open up as well. This can increase the risk for infection. When in doubt call the office.  Surgical incisions should be dressed daily.  If any drainage is noted, use one layer of adaptic, then gauze, Kerlix, and an ace wrap.  Once the incision is completely dry and without drainage, it may be left open to air out.  Showering may begin 36-48 hours later.  Cleaning gently with soap and water.  Traumatic wounds should be dressed daily as well.    One layer of adaptic, gauze, Kerlix, then ace wrap.  The adaptic can be discontinued once the draining has ceased    If you have a wet to dry dressing: wet the gauze with saline the squeeze as much saline out so the gauze is moist (not soaking wet), place moistened gauze over wound, then place a dry gauze over the moist one, followed by Kerlix wrap, then ace wrap.
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
## 2020-07-18 NOTE — Anesthesia Procedure Notes (Signed)
Procedure Name: LMA Insertion Date/Time: 07/18/2020 8:15 AM Performed by: Rosalio Macadamia, CRNA Pre-anesthesia Checklist: Patient identified, Emergency Drugs available, Suction available and Patient being monitored Patient Re-evaluated:Patient Re-evaluated prior to induction Oxygen Delivery Method: Circle System Utilized Preoxygenation: Pre-oxygenation with 100% oxygen Induction Type: IV induction Ventilation: Mask ventilation without difficulty LMA: LMA inserted LMA Size: 5.0 Number of attempts: 1 Airway Equipment and Method: Bite block Placement Confirmation: positive ETCO2 Tube secured with: Tape Dental Injury: Teeth and Oropharynx as per pre-operative assessment

## 2020-07-18 NOTE — Plan of Care (Signed)

## 2020-07-18 NOTE — Progress Notes (Signed)
Patient left for OR at this time.

## 2020-07-18 NOTE — Transfer of Care (Signed)
Immediate Anesthesia Transfer of Care Note  Patient: David Spears  Procedure(s) Performed: SECONDARY CLOSURE OF FASCIOTOMY WOUNDS (Right )  Patient Location: PACU  Anesthesia Type:General  Level of Consciousness: awake, alert , oriented, patient cooperative and responds to stimulation  Airway & Oxygen Therapy: Patient Spontanous Breathing  Post-op Assessment: Report given to RN, Post -op Vital signs reviewed and stable and Patient moving all extremities X 4  Post vital signs: Reviewed and stable  Last Vitals:  Vitals Value Taken Time  BP 109/80 07/18/20 0922  Temp    Pulse 123 07/18/20 0925  Resp 25 07/18/20 0925  SpO2 100 % 07/18/20 0925  Vitals shown include unvalidated device data.  Last Pain:  Vitals:   07/18/20 0420  TempSrc:   PainSc: 4       Patients Stated Pain Goal: 1 (07/14/20 1600)  Complications: No complications documented.
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
## 2020-07-18 NOTE — Anesthesia Postprocedure Evaluation (Signed)
Anesthesia Post Note  Patient: David Spears  Procedure(s) Performed: SECONDARY CLOSURE OF FASCIOTOMY WOUNDS (Right )     Patient location during evaluation: PACU Anesthesia Type: General Level of consciousness: awake and alert Pain management: pain level controlled Vital Signs Assessment: post-procedure vital signs reviewed and stable Respiratory status: spontaneous breathing, nonlabored ventilation and respiratory function stable Cardiovascular status: blood pressure returned to baseline and stable Postop Assessment: no apparent nausea or vomiting Anesthetic complications: no   No complications documented.  Last Vitals:  Vitals:   07/18/20 0935 07/18/20 0950  BP: 125/77 (!) 117/52  Pulse: 73 78  Resp: 18 12  Temp:  36.7 C  SpO2: 100% 100%    Last Pain:  Vitals:   07/18/20 0950  TempSrc:   PainSc: 0-No pain                 Beryle Lathe

## 2020-07-18 NOTE — Progress Notes (Signed)
Dr. Derrell Lolling placed order for blood for pt Hgb of 6.7. Writer contacted OR to make aware and draw type and screen to give blood in OR.

## 2020-07-18 NOTE — Addendum Note (Signed)
Addendum  created 07/18/20 1154 by Rosalio Macadamia, CRNA   Charge Capture section accepted

## 2020-07-18 NOTE — Interval H&P Note (Signed)
History and Physical Interval Note:  07/18/2020 7:51 AM  David Spears  has presented today for surgery, with the diagnosis of Fasciotomy wounds.  The various methods of treatment have been discussed with the patient and family. After consideration of risks, benefits and other options for treatment, the patient has consented to  Procedure(s): SECONDARY CLOSURE OF FASCIOTOMY WOUNDS (Right) as a surgical intervention.  The patient's history has been reviewed, patient examined, no change in status, stable for surgery.  I have reviewed the patient's chart and labs.  Questions were answered to the patient's satisfaction.     Caryn Bee P Anhelica Fowers

## 2020-07-19 ENCOUNTER — Encounter (HOSPITAL_COMMUNITY): Payer: Self-pay | Admitting: Student

## 2020-07-19 LAB — BASIC METABOLIC PANEL
Anion gap: 8 (ref 5–15)
BUN: 9 mg/dL (ref 6–20)
CO2: 28 mmol/L (ref 22–32)
Calcium: 8.1 mg/dL — ABNORMAL LOW (ref 8.9–10.3)
Chloride: 101 mmol/L (ref 98–111)
Creatinine, Ser: 0.77 mg/dL (ref 0.61–1.24)
GFR, Estimated: >60 mL/min (ref >60)
Glucose, Bld: 147 mg/dL — ABNORMAL HIGH (ref 70–99)
Potassium: 4.4 mmol/L (ref 3.5–5.1)
Sodium: 137 mmol/L (ref 135–145)

## 2020-07-19 LAB — CBC
HCT: 19.9 % — ABNORMAL LOW (ref 39.0–52.0)
HCT: 23.4 % — ABNORMAL LOW (ref 39.0–52.0)
Hemoglobin: 6.9 g/dL — CL (ref 13.0–17.0)
Hemoglobin: 7.8 g/dL — ABNORMAL LOW (ref 13.0–17.0)
MCH: 30.4 pg (ref 26.0–34.0)
MCH: 29 pg (ref 26.0–34.0)
MCHC: 34.7 g/dL (ref 30.0–36.0)
MCHC: 33.3 g/dL (ref 30.0–36.0)
MCV: 87.7 fL (ref 80.0–100.0)
MCV: 87 fL (ref 80.0–100.0)
Platelets: 165 10*3/uL (ref 150–400)
Platelets: 215 10*3/uL (ref 150–400)
RBC: 2.27 MIL/uL — ABNORMAL LOW (ref 4.22–5.81)
RBC: 2.69 MIL/uL — ABNORMAL LOW (ref 4.22–5.81)
RDW: 13.4 % (ref 11.5–15.5)
RDW: 14.6 % (ref 11.5–15.5)
WBC: 12.2 10*3/uL — ABNORMAL HIGH (ref 4.0–10.5)
WBC: 10.9 10*3/uL — ABNORMAL HIGH (ref 4.0–10.5)
nRBC: 0.3 % — ABNORMAL HIGH (ref 0.0–0.2)
nRBC: 0.5 % — ABNORMAL HIGH (ref 0.0–0.2)

## 2020-07-19 LAB — PREPARE RBC (CROSSMATCH)

## 2020-07-19 MED ORDER — DIPHENHYDRAMINE HCL 25 MG PO CAPS
25.0000 mg | ORAL_CAPSULE | Freq: Once | ORAL | Status: AC
  Administered 2020-07-19: 25 mg via ORAL
  Filled 2020-07-19: qty 1

## 2020-07-19 MED ORDER — HYDROMORPHONE HCL 1 MG/ML IJ SOLN: 0.5000 mg | Freq: Four times a day (QID) | INTRAMUSCULAR | Status: DC | PRN

## 2020-07-19 MED ORDER — OXYCODONE HCL 5 MG PO TABS
10.0000 mg | ORAL_TABLET | ORAL | Status: DC | PRN
  Administered 2020-07-19 – 2020-07-20 (×3): 10 mg via ORAL
  Filled 2020-07-19 (×3): qty 2

## 2020-07-19 MED ORDER — SODIUM CHLORIDE 0.9% IV SOLUTION: Freq: Once | INTRAVENOUS | Status: AC

## 2020-07-19 MED ORDER — KETOROLAC TROMETHAMINE 15 MG/ML IJ SOLN
15.0000 mg | Freq: Four times a day (QID) | INTRAMUSCULAR | Status: DC | PRN
  Administered 2020-07-20 – 2020-07-21 (×3): 15 mg via INTRAVENOUS
  Filled 2020-07-19 (×4): qty 1

## 2020-07-19 MED ORDER — POLYETHYLENE GLYCOL 3350 17 G PO PACK
17.0000 g | PACK | Freq: Every day | ORAL | Status: DC
  Administered 2020-07-19 – 2020-07-20 (×2): 17 g via ORAL
  Filled 2020-07-19 (×3): qty 1

## 2020-07-19 MED ORDER — OXYCODONE HCL 5 MG PO TABS: 5.0000 mg | ORAL_TABLET | ORAL | Status: DC | PRN

## 2020-07-19 MED ORDER — BOOST / RESOURCE BREEZE PO LIQD CUSTOM
1.0000 | Freq: Three times a day (TID) | ORAL | Status: DC
  Administered 2020-07-20 (×2): 1 via ORAL
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
## 2020-07-19 NOTE — Progress Notes (Signed)
As RN was educating pt on blood transfusion procedure he said that he believes he had itching and dizziness last time he received a transfusion, but did not know that it was abnormal. Trauma PA notified and orders for 25 mg Benadryl received to give pt prior to transfusion. At 15 minute mark, pt's vital signs were all WNL and no complaints of adverse side effects.   Robina Ade, RN

## 2020-07-19 NOTE — Progress Notes (Signed)
Patient called the desk around 5 am and I  overheard the beeping of the wound vac on the background. It was time for for his scheduled pain medications so I pulled them and went to his room and noticed the wound vacuum indicated low power. It was plugged  onto the outlet under the bed so I removed it and plugged  it onto the wall and it stopped beeping and started working again. I administered his scheduled pain medications . I asked of his pain level and he told me it 4/10 on the pain scale. I asked him what else I could do for him while I was in there. He replied he wanted to "pee'. I waited and emptied the urinal. All this while the wound vac was working efficiently.

## 2020-07-19 NOTE — Progress Notes (Addendum)
Upon assessment and chart review, pt has not had bm since admission. Colace is ordered. Text-page send to Dr. Janee Morn to request additional stool softer/laxative.   Also asked Dr. Janee Morn if he would like to re-check hgb. Note from Conner earlier says will recheck tomorrow am - would like to confirm to not recheck today. Hgb 6.9 this am, 1 unit PRBC received with no reactions. Transfusion ended around 1235.   1735: Verbal orders from Dr. Janee Morn for daily Miralax and CBC. Robina Ade, RN
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
## 2020-07-19 NOTE — Progress Notes (Signed)
Incoming RN and I  went to the  patient's room during shift change and observed the wound vac beeping indicating occlusion. We looked and observed large amountl of new serosanguinous drainage beneath the patient's butt.  Patient stated the machine has been beeping again for about 10 minutes and he called but no one came to fix it. I wasn't made aware that he called. Trouble shooting the machine we noticed the line was kinked. The machine started working again after it was unkinked. Patient was complaining of excruciating pain on his right leg at that time. The MD was notified by the charge RN of the problem in question and I medicated him with Dilaudid 0.5 mg PRN. Staff will continue to monitor.
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
## 2020-07-20 ENCOUNTER — Encounter (HOSPITAL_COMMUNITY): Payer: Self-pay | Admitting: Student

## 2020-07-20 LAB — BASIC METABOLIC PANEL
Anion gap: 8 (ref 5–15)
BUN: 10 mg/dL (ref 6–20)
CO2: 26 mmol/L (ref 22–32)
Calcium: 8.2 mg/dL — ABNORMAL LOW (ref 8.9–10.3)
Chloride: 104 mmol/L (ref 98–111)
Creatinine, Ser: 0.76 mg/dL (ref 0.61–1.24)
GFR, Estimated: >60 mL/min (ref >60)
Glucose, Bld: 113 mg/dL — ABNORMAL HIGH (ref 70–99)
Potassium: 3.9 mmol/L (ref 3.5–5.1)
Sodium: 138 mmol/L (ref 135–145)

## 2020-07-20 LAB — TYPE AND SCREEN
ABO/RH(D): A POS
Antibody Screen: NEGATIVE
Unit division: 0
Unit division: 0

## 2020-07-20 LAB — CBC
HCT: 22 % — ABNORMAL LOW (ref 39.0–52.0)
Hemoglobin: 7.3 g/dL — ABNORMAL LOW (ref 13.0–17.0)
MCH: 29 pg (ref 26.0–34.0)
MCHC: 33.2 g/dL (ref 30.0–36.0)
MCV: 87.3 fL (ref 80.0–100.0)
Platelets: 227 10*3/uL (ref 150–400)
RBC: 2.52 MIL/uL — ABNORMAL LOW (ref 4.22–5.81)
RDW: 14.8 % (ref 11.5–15.5)
WBC: 10.4 10*3/uL (ref 4.0–10.5)
nRBC: 0.4 % — ABNORMAL HIGH (ref 0.0–0.2)

## 2020-07-20 LAB — BPAM RBC
Blood Product Expiration Date: 202112032359
Blood Product Expiration Date: 202112152359
ISSUE DATE / TIME: 202111240758
ISSUE DATE / TIME: 202111250936
Unit Type and Rh: 6200
Unit Type and Rh: 6200

## 2020-07-20 MED ORDER — GABAPENTIN 400 MG PO CAPS
400.0000 mg | ORAL_CAPSULE | Freq: Three times a day (TID) | ORAL | Status: DC
  Administered 2020-07-20 – 2020-07-21 (×4): 400 mg via ORAL
  Filled 2020-07-20 (×4): qty 1

## 2020-07-20 MED ORDER — OXYCODONE HCL 5 MG PO TABS
5.0000 mg | ORAL_TABLET | ORAL | Status: DC | PRN
  Administered 2020-07-20 – 2020-07-21 (×4): 10 mg via ORAL
  Filled 2020-07-20 (×4): qty 2

## 2020-07-20 MED ORDER — METHOCARBAMOL 500 MG PO TABS
1000.0000 mg | ORAL_TABLET | Freq: Three times a day (TID) | ORAL | Status: DC | PRN
  Administered 2020-07-21: 1000 mg via ORAL
  Filled 2020-07-20: qty 2
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
## 2020-07-21 DIAGNOSIS — T148XXA Other injury of unspecified body region, initial encounter: Secondary | ICD-10-CM | POA: Diagnosis present

## 2020-07-21 DIAGNOSIS — T1490XA Injury, unspecified, initial encounter: Secondary | ICD-10-CM | POA: Diagnosis present

## 2020-07-21 LAB — CBC
HCT: 25.6 % — ABNORMAL LOW (ref 39.0–52.0)
Hemoglobin: 8.4 g/dL — ABNORMAL LOW (ref 13.0–17.0)
MCH: 29.2 pg (ref 26.0–34.0)
MCHC: 32.8 g/dL (ref 30.0–36.0)
MCV: 88.9 fL (ref 80.0–100.0)
Platelets: 321 10*3/uL (ref 150–400)
RBC: 2.88 MIL/uL — ABNORMAL LOW (ref 4.22–5.81)
RDW: 14.6 % (ref 11.5–15.5)
WBC: 12.7 10*3/uL — ABNORMAL HIGH (ref 4.0–10.5)
nRBC: 0.3 % — ABNORMAL HIGH (ref 0.0–0.2)

## 2020-07-21 MED ORDER — ASPIRIN 81 MG PO TBEC
325.0000 mg | DELAYED_RELEASE_TABLET | Freq: Every day | ORAL | 11 refills | Status: DC
Start: 2020-07-22 — End: 2021-09-05

## 2020-07-21 MED ORDER — DOCUSATE SODIUM 100 MG PO CAPS
100.0000 mg | ORAL_CAPSULE | Freq: Two times a day (BID) | ORAL | 0 refills | Status: DC
Start: 2020-07-21 — End: 2021-09-05

## 2020-07-21 MED ORDER — GABAPENTIN 300 MG PO CAPS
300.0000 mg | ORAL_CAPSULE | Freq: Three times a day (TID) | ORAL | 0 refills | Status: DC
Start: 2020-07-21 — End: 2021-09-05

## 2020-07-21 MED ORDER — ENOXAPARIN SODIUM 30 MG/0.3ML ~~LOC~~ SOLN: 30.0000 mg | Freq: Two times a day (BID) | SUBCUTANEOUS | Status: DC

## 2020-07-21 MED ORDER — OXYCODONE HCL 5 MG PO TABS: 5.0000 mg | ORAL_TABLET | Freq: Four times a day (QID) | ORAL | Status: DC | PRN

## 2020-07-21 MED ORDER — OXYCODONE HCL 5 MG PO TABS
5.0000 mg | ORAL_TABLET | Freq: Three times a day (TID) | ORAL | 0 refills | Status: DC | PRN
Start: 2020-07-21 — End: 2021-09-05

## 2020-07-21 MED ORDER — ACETAMINOPHEN 500 MG PO TABS
1000.0000 mg | ORAL_TABLET | Freq: Four times a day (QID) | ORAL | 0 refills | Status: DC
Start: 2020-07-21 — End: 2021-09-05

## 2020-07-21 MED ORDER — METHOCARBAMOL 500 MG PO TABS
1000.0000 mg | ORAL_TABLET | Freq: Three times a day (TID) | ORAL | 0 refills | Status: DC | PRN
Start: 2020-07-21 — End: 2020-08-25

## 2020-07-21 NOTE — TOC Transition Note (Signed)
Transition of Care [facility]) - CM/SW Discharge Note   Patient Details  Name: David Spears MRN: [id number] Date of Birth: [birth year]  Transition of Care [facility] CM/SW Contact:  Lawerance Sabal, RN Phone Number: 07/21/2020, 11:45 AM   Clinical Narrative:   Spoke to patient over the phone. Discussed DC plan. Will DC to home in care of wife. DME RW, 3/1, WC to be delivered to room prior to DC. No other CM needs identified.     Final next level of care: Home/Self Care Barriers to Discharge: No Barriers Identified   Patient Goals and CMS Choice        Discharge Placement                       Discharge Plan and Services                DME Arranged: 3-N-1, Walker rolling, Lightweight manual wheelchair with seat cushion DME Agency: [employer] Date DME Agency Contacted: 07/21/20 Time DME Agency Contacted: 1145 Representative spoke with at DME Agency: Arnold Long            Social Determinants of Health (SDOH) Interventions     Readmission Risk Interventions No flowsheet data found.
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
## 2020-07-23 ENCOUNTER — Encounter (HOSPITAL_COMMUNITY): Payer: Self-pay
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
## 2020-08-08 ENCOUNTER — Other Ambulatory Visit: Payer: Self-pay

## 2020-08-08 ENCOUNTER — Ambulatory Visit (INDEPENDENT_AMBULATORY_CARE_PROVIDER_SITE_OTHER): Payer: Self-pay | Admitting: Vascular Surgery

## 2020-08-08 ENCOUNTER — Encounter: Payer: Self-pay | Admitting: Vascular Surgery

## 2020-08-08 VITALS — BP 127/87 | HR 75 | Temp 98.0°F | Resp 20 | Ht 70.0 in | Wt 180.0 lb

## 2020-08-08 DIAGNOSIS — T148XXA Other injury of unspecified body region, initial encounter: Secondary | ICD-10-CM

## 2020-08-08 DIAGNOSIS — W3400XA Accidental discharge from unspecified firearms or gun, initial encounter: Secondary | ICD-10-CM

## 2020-08-08 DIAGNOSIS — Z48812 Encounter for surgical aftercare following surgery on the circulatory system: Secondary | ICD-10-CM
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
## 2020-08-10 ENCOUNTER — Other Ambulatory Visit: Payer: Self-pay

## 2020-08-10 DIAGNOSIS — W3400XA Accidental discharge from unspecified firearms or gun, initial encounter: Secondary | ICD-10-CM

## 2020-08-10 DIAGNOSIS — T148XXA Other injury of unspecified body region, initial encounter: Secondary | ICD-10-CM

## 2020-08-25 ENCOUNTER — Other Ambulatory Visit (HOSPITAL_COMMUNITY): Payer: Self-pay | Admitting: Surgical

## 2020-08-25 MED ORDER — METHOCARBAMOL 500 MG PO TABS: 1000.0000 mg | ORAL_TABLET | Freq: Three times a day (TID) | ORAL | 0 refills | Status: DC | PRN

## 2020-09-06 ENCOUNTER — Ambulatory Visit: Payer: Medicaid Other

## 2020-09-10 ENCOUNTER — Ambulatory Visit: Payer: Medicaid Other | Admitting: Physical Therapy

## 2020-09-18 ENCOUNTER — Encounter: Payer: Self-pay | Admitting: Physical Therapy

## 2020-09-18 ENCOUNTER — Other Ambulatory Visit: Payer: Self-pay

## 2020-09-18 ENCOUNTER — Ambulatory Visit: Payer: Medicaid Other | Attending: Student | Admitting: Physical Therapy

## 2020-09-18 DIAGNOSIS — M25551 Pain in right hip: Secondary | ICD-10-CM | POA: Diagnosis present

## 2020-09-18 DIAGNOSIS — R208 Other disturbances of skin sensation: Secondary | ICD-10-CM | POA: Insufficient documentation

## 2020-09-18 DIAGNOSIS — M96661 Fracture of femur following insertion of orthopedic implant, joint prosthesis, or bone plate, right leg: Secondary | ICD-10-CM | POA: Insufficient documentation

## 2020-09-18 DIAGNOSIS — M6281 Muscle weakness (generalized): Secondary | ICD-10-CM | POA: Insufficient documentation

## 2020-09-18 DIAGNOSIS — M25561 Pain in right knee: Secondary | ICD-10-CM | POA: Insufficient documentation

## 2020-09-18 DIAGNOSIS — M25571 Pain in right ankle and joints of right foot: Secondary | ICD-10-CM | POA: Insufficient documentation

## 2020-09-18 DIAGNOSIS — G8929 Other chronic pain: Secondary | ICD-10-CM

## 2020-09-18 DIAGNOSIS — R2689 Other abnormalities of gait and mobility: Secondary | ICD-10-CM | POA: Diagnosis present
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
## 2020-09-21 ENCOUNTER — Telehealth: Payer: Self-pay | Admitting: Physical Therapy

## 2020-09-21 ENCOUNTER — Encounter: Payer: Medicaid Other | Admitting: Physical Therapy

## 2020-09-21 NOTE — Telephone Encounter (Signed)
Spoke with pt regarding missed appointment today. He stated he switched up his appointment times thinking it was later today. I reviewed his next appointment day and time and he confirmed that he will be at his next scheduled appointment.   Elgar Scoggins PT, DPT, LAT, ATC  09/21/20  11:04 AM

## 2020-09-22 ENCOUNTER — Ambulatory Visit: Payer: Medicaid Other

## 2020-09-25 ENCOUNTER — Ambulatory Visit: Payer: Medicaid Other

## 2020-09-27 ENCOUNTER — Ambulatory Visit: Payer: Medicaid Other | Attending: Student

## 2020-09-27 ENCOUNTER — Other Ambulatory Visit: Payer: Self-pay

## 2020-09-27 DIAGNOSIS — M6281 Muscle weakness (generalized): Secondary | ICD-10-CM | POA: Insufficient documentation

## 2020-09-27 DIAGNOSIS — R2689 Other abnormalities of gait and mobility: Secondary | ICD-10-CM | POA: Diagnosis present

## 2020-09-27 DIAGNOSIS — M25571 Pain in right ankle and joints of right foot: Secondary | ICD-10-CM | POA: Diagnosis present

## 2020-09-27 DIAGNOSIS — M25551 Pain in right hip: Secondary | ICD-10-CM | POA: Diagnosis not present

## 2020-09-27 DIAGNOSIS — M25561 Pain in right knee: Secondary | ICD-10-CM | POA: Insufficient documentation

## 2020-09-27 DIAGNOSIS — G8929 Other chronic pain: Secondary | ICD-10-CM | POA: Insufficient documentation

## 2020-09-27 DIAGNOSIS — R208 Other disturbances of skin sensation: Secondary | ICD-10-CM | POA: Insufficient documentation
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
## 2020-10-02 ENCOUNTER — Ambulatory Visit: Payer: Medicaid Other

## 2020-10-02 ENCOUNTER — Other Ambulatory Visit: Payer: Self-pay

## 2020-10-02 ENCOUNTER — Telehealth: Payer: Self-pay

## 2020-10-02 DIAGNOSIS — M6281 Muscle weakness (generalized): Secondary | ICD-10-CM

## 2020-10-02 DIAGNOSIS — G8929 Other chronic pain: Secondary | ICD-10-CM

## 2020-10-02 DIAGNOSIS — R208 Other disturbances of skin sensation: Secondary | ICD-10-CM

## 2020-10-02 DIAGNOSIS — M25551 Pain in right hip: Secondary | ICD-10-CM

## 2020-10-02 DIAGNOSIS — M25571 Pain in right ankle and joints of right foot: Secondary | ICD-10-CM

## 2020-10-02 DIAGNOSIS — R2689 Other abnormalities of gait and mobility: Secondary | ICD-10-CM
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
## 2020-10-05 ENCOUNTER — Ambulatory Visit: Payer: Medicaid Other

## 2020-10-05 ENCOUNTER — Telehealth: Payer: Self-pay

## 2020-10-05 NOTE — Telephone Encounter (Signed)
Spoke pt. Re: no show visit. Pt states he tried to call but there was a bad connection. Review attendance policy and reminded pt of his next appt on 2/16. Pt voiced understanding.

## 2020-10-10 ENCOUNTER — Other Ambulatory Visit: Payer: Self-pay

## 2020-10-10 ENCOUNTER — Ambulatory Visit: Payer: Medicaid Other

## 2020-10-10 DIAGNOSIS — R2689 Other abnormalities of gait and mobility: Secondary | ICD-10-CM

## 2020-10-10 DIAGNOSIS — M25571 Pain in right ankle and joints of right foot: Secondary | ICD-10-CM

## 2020-10-10 DIAGNOSIS — R208 Other disturbances of skin sensation: Secondary | ICD-10-CM

## 2020-10-10 DIAGNOSIS — G8929 Other chronic pain: Secondary | ICD-10-CM

## 2020-10-10 DIAGNOSIS — M6281 Muscle weakness (generalized): Secondary | ICD-10-CM

## 2020-10-10 DIAGNOSIS — M25561 Pain in right knee: Secondary | ICD-10-CM

## 2020-10-10 DIAGNOSIS — M25551 Pain in right hip: Secondary | ICD-10-CM
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
## 2020-10-12 ENCOUNTER — Ambulatory Visit: Payer: Medicaid Other

## 2020-10-16 ENCOUNTER — Ambulatory Visit: Payer: Medicaid Other

## 2020-10-17 ENCOUNTER — Telehealth: Payer: Self-pay

## 2020-10-17 NOTE — Telephone Encounter (Signed)
Appt Can by PT. Waiting on MCD approval.

## 2020-10-18 ENCOUNTER — Ambulatory Visit: Payer: Medicaid Other

## 2020-10-23 ENCOUNTER — Other Ambulatory Visit: Payer: Self-pay

## 2020-10-23 ENCOUNTER — Ambulatory Visit: Payer: Medicaid Other | Attending: Student

## 2020-10-23 DIAGNOSIS — M25551 Pain in right hip: Secondary | ICD-10-CM | POA: Diagnosis present

## 2020-10-23 DIAGNOSIS — M6281 Muscle weakness (generalized): Secondary | ICD-10-CM | POA: Diagnosis present

## 2020-10-23 DIAGNOSIS — R208 Other disturbances of skin sensation: Secondary | ICD-10-CM | POA: Diagnosis present

## 2020-10-23 DIAGNOSIS — R2689 Other abnormalities of gait and mobility: Secondary | ICD-10-CM | POA: Diagnosis present

## 2020-10-23 DIAGNOSIS — M25571 Pain in right ankle and joints of right foot: Secondary | ICD-10-CM | POA: Insufficient documentation

## 2020-10-23 DIAGNOSIS — M25561 Pain in right knee: Secondary | ICD-10-CM | POA: Insufficient documentation

## 2020-10-23 DIAGNOSIS — G8929 Other chronic pain: Secondary | ICD-10-CM | POA: Insufficient documentation
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
## 2020-10-25 ENCOUNTER — Ambulatory Visit: Payer: Medicaid Other

## 2020-10-25 ENCOUNTER — Telehealth: Payer: Self-pay

## 2020-10-25 NOTE — Telephone Encounter (Signed)
Called pt re: no show appt today. Pt was advised that per attendance policy with another no show visit he will be DCed from PT services. Pt was reminded of upcoming appt on 10/30/20 at 2pm.

## 2020-10-30 ENCOUNTER — Ambulatory Visit: Payer: Medicaid Other

## 2020-11-01 ENCOUNTER — Other Ambulatory Visit: Payer: Self-pay

## 2020-11-01 ENCOUNTER — Ambulatory Visit: Payer: Medicaid Other

## 2020-11-01 DIAGNOSIS — M25571 Pain in right ankle and joints of right foot: Secondary | ICD-10-CM

## 2020-11-01 DIAGNOSIS — R2689 Other abnormalities of gait and mobility: Secondary | ICD-10-CM

## 2020-11-01 DIAGNOSIS — M6281 Muscle weakness (generalized): Secondary | ICD-10-CM

## 2020-11-01 DIAGNOSIS — M25551 Pain in right hip: Secondary | ICD-10-CM

## 2020-11-01 DIAGNOSIS — G8929 Other chronic pain: Secondary | ICD-10-CM

## 2020-11-01 DIAGNOSIS — R208 Other disturbances of skin sensation: Secondary | ICD-10-CM
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
## 2020-11-06 ENCOUNTER — Ambulatory Visit: Payer: Medicaid Other

## 2020-11-07 ENCOUNTER — Telehealth: Payer: Self-pay

## 2020-11-07 NOTE — Telephone Encounter (Signed)
Spoke with pt. Pt sates he forgot about the appt. Pt was advised he would be DCed if he has another no show

## 2020-11-08 ENCOUNTER — Other Ambulatory Visit: Payer: Self-pay

## 2020-11-08 ENCOUNTER — Ambulatory Visit: Payer: Medicaid Other

## 2020-11-08 DIAGNOSIS — R2689 Other abnormalities of gait and mobility: Secondary | ICD-10-CM

## 2020-11-08 DIAGNOSIS — M25551 Pain in right hip: Secondary | ICD-10-CM | POA: Diagnosis not present

## 2020-11-08 DIAGNOSIS — M6281 Muscle weakness (generalized): Secondary | ICD-10-CM

## 2020-11-08 DIAGNOSIS — R208 Other disturbances of skin sensation: Secondary | ICD-10-CM

## 2020-11-08 DIAGNOSIS — G8929 Other chronic pain: Secondary | ICD-10-CM

## 2020-11-08 DIAGNOSIS — M25561 Pain in right knee: Secondary | ICD-10-CM

## 2020-11-08 DIAGNOSIS — M25571 Pain in right ankle and joints of right foot: Secondary | ICD-10-CM
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
## 2020-11-14 ENCOUNTER — Ambulatory Visit: Payer: Medicaid Other

## 2020-11-19 ENCOUNTER — Telehealth: Payer: Self-pay

## 2020-11-19 ENCOUNTER — Ambulatory Visit: Payer: Medicaid Other

## 2020-11-19 NOTE — Telephone Encounter (Signed)
Spoke with patient regarding no-show appointment today. Patient stated he thought his appointment was on 11/21/20 and did not receive a reminder about today's scheduled visit. Explained that this was now his 6th no show appointment and per our attendance policy he will be discharged at this time and require a new referral to continue with PT.

## 2020-11-21 ENCOUNTER — Ambulatory Visit: Payer: Medicaid Other

## 2020-11-21 ENCOUNTER — Telehealth: Payer: Self-pay | Admitting: Physical Therapy

## 2020-11-21 NOTE — Telephone Encounter (Signed)
LVM about the new referral that was sent by his provider. I discussed that we are happy to continue treating him with the next appointment being a new evaluation since he was previously discharged. Based on previous attendance we can only schedule 1 appointment at a time, if he is unable to make his appointment to call before his scheduled appointment so we can cancel or reschedule that appointment and it won't count against him.   I reviewed attendance policy that if he misses 3 appointments , per clinic policy is grounds for discharge, that it is important to call to avoid being marked as a no show. If he has any questions he is more than welcome to call us back.  Sariyah Corcino PT, DPT, LAT, ATC  11/21/20  9:33 AM

## 2020-11-28 ENCOUNTER — Ambulatory Visit: Payer: Medicaid Other

## 2020-12-13 ENCOUNTER — Ambulatory Visit: Payer: Medicaid Other

## 2020-12-13 ENCOUNTER — Ambulatory Visit: Payer: Medicaid Other | Attending: Student

## 2020-12-13 ENCOUNTER — Other Ambulatory Visit: Payer: Self-pay

## 2020-12-13 DIAGNOSIS — M25571 Pain in right ankle and joints of right foot: Secondary | ICD-10-CM | POA: Insufficient documentation

## 2020-12-13 DIAGNOSIS — M25561 Pain in right knee: Secondary | ICD-10-CM | POA: Diagnosis present

## 2020-12-13 DIAGNOSIS — M6281 Muscle weakness (generalized): Secondary | ICD-10-CM | POA: Diagnosis present

## 2020-12-13 DIAGNOSIS — R2689 Other abnormalities of gait and mobility: Secondary | ICD-10-CM | POA: Insufficient documentation

## 2020-12-13 DIAGNOSIS — R208 Other disturbances of skin sensation: Secondary | ICD-10-CM | POA: Diagnosis present

## 2020-12-13 DIAGNOSIS — M25671 Stiffness of right ankle, not elsewhere classified: Secondary | ICD-10-CM | POA: Diagnosis present

## 2020-12-13 DIAGNOSIS — G8929 Other chronic pain: Secondary | ICD-10-CM | POA: Insufficient documentation
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
## 2020-12-31 ENCOUNTER — Encounter: Payer: Self-pay | Admitting: Physical Therapy

## 2020-12-31 ENCOUNTER — Ambulatory Visit: Payer: Medicaid Other | Attending: Student | Admitting: Physical Therapy

## 2020-12-31 ENCOUNTER — Other Ambulatory Visit: Payer: Self-pay

## 2020-12-31 DIAGNOSIS — M25671 Stiffness of right ankle, not elsewhere classified: Secondary | ICD-10-CM | POA: Insufficient documentation

## 2020-12-31 DIAGNOSIS — M6281 Muscle weakness (generalized): Secondary | ICD-10-CM

## 2020-12-31 DIAGNOSIS — G8929 Other chronic pain: Secondary | ICD-10-CM

## 2020-12-31 DIAGNOSIS — R2689 Other abnormalities of gait and mobility: Secondary | ICD-10-CM

## 2020-12-31 DIAGNOSIS — M25571 Pain in right ankle and joints of right foot: Secondary | ICD-10-CM

## 2020-12-31 DIAGNOSIS — R208 Other disturbances of skin sensation: Secondary | ICD-10-CM | POA: Insufficient documentation

## 2020-12-31 DIAGNOSIS — M25561 Pain in right knee: Secondary | ICD-10-CM | POA: Diagnosis present
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
## 2021-01-08 ENCOUNTER — Encounter: Payer: Self-pay | Admitting: Physical Therapy

## 2021-01-08 ENCOUNTER — Other Ambulatory Visit: Payer: Self-pay

## 2021-01-08 ENCOUNTER — Ambulatory Visit: Payer: Medicaid Other | Admitting: Physical Therapy

## 2021-01-08 DIAGNOSIS — M25561 Pain in right knee: Secondary | ICD-10-CM

## 2021-01-08 DIAGNOSIS — R208 Other disturbances of skin sensation: Secondary | ICD-10-CM

## 2021-01-08 DIAGNOSIS — G8929 Other chronic pain: Secondary | ICD-10-CM

## 2021-01-08 DIAGNOSIS — R2689 Other abnormalities of gait and mobility: Secondary | ICD-10-CM

## 2021-01-08 DIAGNOSIS — M6281 Muscle weakness (generalized): Secondary | ICD-10-CM

## 2021-01-08 DIAGNOSIS — M25571 Pain in right ankle and joints of right foot: Secondary | ICD-10-CM | POA: Diagnosis not present

## 2021-01-08 DIAGNOSIS — M25671 Stiffness of right ankle, not elsewhere classified: Secondary | ICD-10-CM
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
## 2021-01-16 ENCOUNTER — Ambulatory Visit: Payer: Medicaid Other | Admitting: Physical Therapy

## 2021-01-26 ENCOUNTER — Other Ambulatory Visit: Payer: Self-pay

## 2021-01-26 ENCOUNTER — Encounter (HOSPITAL_BASED_OUTPATIENT_CLINIC_OR_DEPARTMENT_OTHER): Payer: Self-pay

## 2021-01-26 ENCOUNTER — Emergency Department (HOSPITAL_BASED_OUTPATIENT_CLINIC_OR_DEPARTMENT_OTHER)
Admission: EM | Admit: 2021-01-26 | Discharge: 2021-01-26 | Disposition: A | Discharge status: Home / Self Care | Payer: Medicaid Other | Attending: Emergency Medicine | Admitting: Emergency Medicine

## 2021-01-26 DIAGNOSIS — J45909 Unspecified asthma, uncomplicated: Secondary | ICD-10-CM | POA: Insufficient documentation

## 2021-01-26 DIAGNOSIS — F17210 Nicotine dependence, cigarettes, uncomplicated: Secondary | ICD-10-CM | POA: Insufficient documentation

## 2021-01-26 DIAGNOSIS — Z113 Encounter for screening for infections with a predominantly sexual mode of transmission: Secondary | ICD-10-CM

## 2021-01-26 DIAGNOSIS — Z202 Contact with and (suspected) exposure to infections with a predominantly sexual mode of transmission: Secondary | ICD-10-CM | POA: Diagnosis not present

## 2021-01-26 DIAGNOSIS — Z7982 Long term (current) use of aspirin: Secondary | ICD-10-CM | POA: Diagnosis not present

## 2021-01-26 LAB — URINALYSIS, ROUTINE W REFLEX MICROSCOPIC
Bilirubin Urine: NEGATIVE
Glucose, UA: NEGATIVE mg/dL
Hgb urine dipstick: NEGATIVE
Ketones, ur: NEGATIVE mg/dL
Leukocytes,Ua: NEGATIVE
Nitrite: NEGATIVE
Protein, ur: NEGATIVE mg/dL
Specific Gravity, Urine: 1.025 (ref 1.005–1.030)
pH: 6.5 (ref 5.0–8.0)

## 2021-01-26 NOTE — Discharge Instructions (Addendum)
You have been screened for potential sexually transmitted infection.  Please check result through MyChart, link below.  If test positive, you will be notified with appropriate treatment.
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
## 2021-01-26 NOTE — ED Triage Notes (Signed)
Pt states he wants "a check up just to be on the safe side". Denies any symptoms. Denies exposure to STDs.

## 2021-01-27 LAB — HIV ANTIBODY (ROUTINE TESTING W REFLEX): HIV Screen 4th Generation wRfx: NONREACTIVE

## 2021-01-28 LAB — RPR: RPR Ser Ql: NONREACTIVE

## 2021-01-28 LAB — GC/CHLAMYDIA PROBE AMP (~~LOC~~) NOT AT ARMC
Chlamydia: NEGATIVE
Comment: NEGATIVE
Comment: NORMAL
Neisseria Gonorrhea: NEGATIVE

## 2021-02-13 ENCOUNTER — Ambulatory Visit (INDEPENDENT_AMBULATORY_CARE_PROVIDER_SITE_OTHER): Payer: Medicaid Other | Admitting: Vascular Surgery

## 2021-02-13 ENCOUNTER — Other Ambulatory Visit: Payer: Self-pay

## 2021-02-13 ENCOUNTER — Ambulatory Visit (HOSPITAL_COMMUNITY)
Admission: RE | Admit: 2021-02-13 | Discharge: 2021-02-13 | Disposition: A | Discharge status: Home / Self Care | Payer: Medicaid Other | Source: Ambulatory Visit | Attending: Vascular Surgery | Admitting: Vascular Surgery

## 2021-02-13 ENCOUNTER — Ambulatory Visit (INDEPENDENT_AMBULATORY_CARE_PROVIDER_SITE_OTHER)
Admission: RE | Admit: 2021-02-13 | Discharge: 2021-02-13 | Disposition: A | Discharge status: Home / Self Care | Payer: Medicaid Other | Source: Ambulatory Visit | Attending: Vascular Surgery | Admitting: Vascular Surgery

## 2021-02-13 ENCOUNTER — Encounter: Payer: Self-pay | Admitting: Vascular Surgery

## 2021-02-13 VITALS — BP 98/59 | HR 60 | Temp 98.1°F | Resp 20 | Ht 74.0 in | Wt 166.0 lb

## 2021-02-13 DIAGNOSIS — T148XXA Other injury of unspecified body region, initial encounter: Secondary | ICD-10-CM

## 2021-02-13 DIAGNOSIS — W3400XA Accidental discharge from unspecified firearms or gun, initial encounter: Secondary | ICD-10-CM

## 2021-02-13 DIAGNOSIS — Z48812 Encounter for surgical aftercare following surgery on the circulatory system: Secondary | ICD-10-CM | POA: Diagnosis not present
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
## 2021-04-01 ENCOUNTER — Emergency Department (HOSPITAL_BASED_OUTPATIENT_CLINIC_OR_DEPARTMENT_OTHER)
Admission: EM | Admit: 2021-04-01 | Discharge: 2021-04-01 | Disposition: A | Discharge status: Home / Self Care | Payer: Medicaid Other | Attending: Emergency Medicine | Admitting: Emergency Medicine

## 2021-04-01 ENCOUNTER — Other Ambulatory Visit: Payer: Self-pay

## 2021-04-01 ENCOUNTER — Encounter (HOSPITAL_BASED_OUTPATIENT_CLINIC_OR_DEPARTMENT_OTHER): Payer: Self-pay | Admitting: Emergency Medicine

## 2021-04-01 ENCOUNTER — Emergency Department (HOSPITAL_BASED_OUTPATIENT_CLINIC_OR_DEPARTMENT_OTHER): Payer: Medicaid Other

## 2021-04-01 DIAGNOSIS — F17210 Nicotine dependence, cigarettes, uncomplicated: Secondary | ICD-10-CM | POA: Insufficient documentation

## 2021-04-01 DIAGNOSIS — Z7982 Long term (current) use of aspirin: Secondary | ICD-10-CM | POA: Insufficient documentation

## 2021-04-01 DIAGNOSIS — M542 Cervicalgia: Secondary | ICD-10-CM | POA: Insufficient documentation

## 2021-04-01 DIAGNOSIS — J45909 Unspecified asthma, uncomplicated: Secondary | ICD-10-CM | POA: Insufficient documentation

## 2021-04-01 DIAGNOSIS — Y92410 Unspecified street and highway as the place of occurrence of the external cause: Secondary | ICD-10-CM | POA: Insufficient documentation

## 2021-04-01 DIAGNOSIS — M79604 Pain in right leg: Secondary | ICD-10-CM

## 2021-04-01 DIAGNOSIS — M79661 Pain in right lower leg: Secondary | ICD-10-CM | POA: Diagnosis not present
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
## 2021-07-11 ENCOUNTER — Other Ambulatory Visit: Payer: Self-pay | Admitting: Student

## 2021-07-11 DIAGNOSIS — S72361D Displaced segmental fracture of shaft of right femur, subsequent encounter for closed fracture with routine healing: Secondary | ICD-10-CM

## 2021-07-29 ENCOUNTER — Ambulatory Visit
Admission: RE | Admit: 2021-07-29 | Discharge: 2021-07-29 | Disposition: A | Discharge status: Home / Self Care | Payer: Medicaid Other | Source: Ambulatory Visit | Attending: Student | Admitting: Student

## 2021-07-29 DIAGNOSIS — S72361D Displaced segmental fracture of shaft of right femur, subsequent encounter for closed fracture with routine healing: Secondary | ICD-10-CM

## 2021-08-13 ENCOUNTER — Ambulatory Visit: Payer: Self-pay | Admitting: Student

## 2021-08-13 DIAGNOSIS — M96661 Fracture of femur following insertion of orthopedic implant, joint prosthesis, or bone plate, right leg: Secondary | ICD-10-CM

## 2021-08-30 NOTE — Progress Notes (Signed)
Surgical Instructions    Your procedure is scheduled on Wednesday, January 11th, 2023.   Report to [facility] Main Entrance "A" at 06:30 A.M., then check in with the Admitting office.  Call this number if you have problems the morning of surgery:  [phone]   If you have any questions prior to your surgery date call [phone]: Open Monday-Friday 8am-4pm    Remember:  Do not eat after midnight the night before your surgery  You may drink clear liquids until 05:30 the morning of your surgery.   Clear liquids allowed are: Water, Non-Citrus Juices (without pulp), Carbonated Beverages, Clear Tea, Black Coffee ONLY (NO MILK, CREAM OR POWDERED CREAMER of any kind), and Gatorade  Patient Instructions  The night before surgery:  No food after midnight. ONLY clear liquids after midnight  The day of surgery (if you do NOT have diabetes):  Drink ONE (1) Pre-Surgery Clear Ensure by 05:30 the morning of surgery. Drink in one sitting. Do not sip.  This drink was given to you during your hospital  pre-op appointment visit.  Nothing else to drink after completing the  Pre-Surgery Clear Ensure.         If you have questions, please contact your surgeons office.     Take these medicines the morning of surgery with A SIP OF WATER:  As needed  gabapentin (NEURONTIN)  methocarbamol (ROBAXIN)  oxyCODONE-acetaminophen (PERCOCET/ROXICET)  As of today, STOP taking any Aspirin (unless otherwise instructed by your surgeon) Aleve, Naproxen, Ibuprofen, Motrin, Advil, Goody's, BC's, all herbal medications, fish oil, and all vitamins.   After your COVID test   You are not required to quarantine however you are required to wear a well-fitting mask when you are out and around people not in your household.  If your mask becomes wet or soiled, replace with a new one.  Wash your hands often with soap and water for 20 seconds or clean your hands with an alcohol-based hand sanitizer that contains at  least 60% alcohol.  Do not share personal items.  Notify your provider: if you are in close contact with someone who has COVID  or if you develop a fever of 100.4 or greater, sneezing, cough, sore throat, shortness of breath or body aches.    The day of surgery:          Do not wear jewelry  Do not wear lotions, powders, colognes, or deodorant. Men may shave face and neck. Do not bring valuables to the hospital.              [facility] is not responsible for any belongings or valuables.  Do NOT Smoke (Tobacco/Vaping)  24 hours prior to your procedure  If you use a CPAP at night, you may bring your mask for your overnight stay.   Contacts, glasses, hearing aids, dentures or partials may not be worn into surgery, please bring cases for these belongings   For patients admitted to the hospital, discharge time will be determined by your treatment team.   Patients discharged the day of surgery will not be allowed to drive home, and someone needs to stay with them for 24 hours.  NO VISITORS WILL BE ALLOWED IN PRE-OP WHERE PATIENTS ARE PREPPED FOR SURGERY.  ONLY 1 SUPPORT PERSON MAY BE PRESENT IN THE WAITING ROOM WHILE YOU ARE IN SURGERY.  IF YOU ARE TO BE ADMITTED, ONCE YOU ARE IN YOUR ROOM YOU WILL BE ALLOWED TWO (2) VISITORS. 1 (ONE) VISITOR MAY STAY  OVERNIGHT BUT MUST ARRIVE TO THE ROOM BY 8pm.  Minor children may have two parents present. Special consideration for safety and communication needs will be reviewed on a case by case basis.  Special instructions:    Oral Hygiene is also important to reduce your risk of infection.  Remember - BRUSH YOUR TEETH THE MORNING OF SURGERY WITH YOUR REGULAR TOOTHPASTE   [facility]- Preparing For Surgery  Before surgery, you can play an important role. Because skin is not sterile, your skin needs to be as free of germs as possible. You can reduce the number of germs on your skin by washing with CHG (chlorahexidine gluconate) Soap before  surgery.  CHG is an antiseptic cleaner which kills germs and bonds with the skin to continue killing germs even after washing.     Please do not use if you have an allergy to CHG or antibacterial soaps. If your skin becomes reddened/irritated stop using the CHG.  Do not shave (including legs and underarms) for at least 48 hours prior to first CHG shower. It is OK to shave your face.  Please follow these instructions carefully.     Shower the NIGHT BEFORE SURGERY and the MORNING OF SURGERY with CHG Soap.   If you chose to wash your hair, wash your hair first as usual with your normal shampoo. After you shampoo, rinse your hair and body thoroughly to remove the shampoo.  Then [employer] and genitals (private parts) with your normal soap and rinse thoroughly to remove soap.  After that Use CHG Soap as you would any other liquid soap. You can apply CHG directly to the skin and wash gently with a scrungie or a clean washcloth.   Apply the CHG Soap to your body ONLY FROM THE NECK DOWN.  Do not use on open wounds or open sores. Avoid contact with your eyes, ears, mouth and genitals (private parts). Wash Face and genitals (private parts)  with your normal soap.   Wash thoroughly, paying special attention to the area where your surgery will be performed.  Thoroughly rinse your body with warm water from the neck down.  DO NOT shower/wash with your normal soap after using and rinsing off the CHG Soap.  Pat yourself dry with a CLEAN TOWEL.  Wear CLEAN PAJAMAS to bed the night before surgery  Place CLEAN SHEETS on your bed the night before your surgery  DO NOT SLEEP WITH PETS.   Day of Surgery:  Take a shower with CHG soap. Wear Clean/Comfortable clothing the morning of surgery Do not apply any deodorants/lotions.   Remember to brush your teeth WITH YOUR REGULAR TOOTHPASTE.   Please read over the following fact sheets that you were given.

## 2021-09-02 ENCOUNTER — Encounter (HOSPITAL_COMMUNITY): Payer: Self-pay

## 2021-09-02 ENCOUNTER — Encounter (HOSPITAL_COMMUNITY)
Admission: RE | Admit: 2021-09-02 | Discharge: 2021-09-02 | Disposition: A | Discharge status: Home / Self Care | Payer: Medicaid Other | Source: Ambulatory Visit | Attending: Student | Admitting: Student

## 2021-09-02 ENCOUNTER — Other Ambulatory Visit: Payer: Self-pay

## 2021-09-02 VITALS — BP 118/58 | HR 66 | Temp 98.6°F | Resp 18 | Ht 74.0 in | Wt 167.9 lb

## 2021-09-02 DIAGNOSIS — Z01812 Encounter for preprocedural laboratory examination: Secondary | ICD-10-CM | POA: Diagnosis present

## 2021-09-02 DIAGNOSIS — Z20822 Contact with and (suspected) exposure to covid-19: Secondary | ICD-10-CM | POA: Diagnosis not present

## 2021-09-02 DIAGNOSIS — Z01818 Encounter for other preprocedural examination: Secondary | ICD-10-CM

## 2021-09-02 LAB — COMPREHENSIVE METABOLIC PANEL
ALT: 21 U/L (ref 0–44)
AST: 18 U/L (ref 15–41)
Albumin: 4 g/dL (ref 3.5–5.0)
Alkaline Phosphatase: 78 U/L (ref 38–126)
Anion gap: 3 — ABNORMAL LOW (ref 5–15)
BUN: 9 mg/dL (ref 6–20)
CO2: 29 mmol/L (ref 22–32)
Calcium: 8.7 mg/dL — ABNORMAL LOW (ref 8.9–10.3)
Chloride: 106 mmol/L (ref 98–111)
Creatinine, Ser: 0.91 mg/dL (ref 0.61–1.24)
GFR, Estimated: >60 mL/min (ref >60)
Glucose, Bld: 104 mg/dL — ABNORMAL HIGH (ref 70–99)
Potassium: 4.1 mmol/L (ref 3.5–5.1)
Sodium: 138 mmol/L (ref 135–145)
Total Bilirubin: 0.6 mg/dL (ref 0.3–1.2)
Total Protein: 6.6 g/dL (ref 6.5–8.1)

## 2021-09-02 LAB — CBC
HCT: 45.6 % (ref 39.0–52.0)
Hemoglobin: 14.6 g/dL (ref 13.0–17.0)
MCH: 27 pg (ref 26.0–34.0)
MCHC: 32 g/dL (ref 30.0–36.0)
MCV: 84.4 fL (ref 80.0–100.0)
Platelets: 200 10*3/uL (ref 150–400)
RBC: 5.4 MIL/uL (ref 4.22–5.81)
RDW: 13.7 % (ref 11.5–15.5)
WBC: 5.3 10*3/uL (ref 4.0–10.5)
nRBC: 0 % (ref 0.0–0.2)

## 2021-09-02 NOTE — Progress Notes (Signed)
PCP - Fleet Contras, MD Cardiologist - denies  PPM/ICD - denies Device Orders - n/a Rep Notified - n/a  Chest x-ray - n/a EKG - n/a Stress Test - denies ECHO - denies Cardiac Cath - denies  Sleep Study - denies CPAP - n/a  Fasting Blood Sugar - n/a  Blood Thinner Instructions: n/a  Aspirin Instructions: Patient was instructed: As of today, STOP taking any Aspirin (unless otherwise instructed by your surgeon) Aleve, Naproxen, Ibuprofen, Motrin, Advil, Goody's, BC's, all herbal medications, fish oil, and all vitamins.    ERAS Protcol - yes PRE-SURGERY Ensure - yes, until 04:30  COVID TEST- done in PAT on 09/02/2021   Anesthesia review: no  Patient denies shortness of breath, fever, cough and chest pain at PAT appointment   All instructions explained to the patient, with a verbal understanding of the material. Patient agrees to go over the instructions while at home for a better understanding. Patient also instructed to self quarantine after being tested for COVID-19. The opportunity to ask questions was provided.

## 2021-09-03 LAB — SARS CORONAVIRUS 2 (TAT 6-24 HRS): SARS Coronavirus 2: NEGATIVE
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
## 2021-09-04 ENCOUNTER — Ambulatory Visit (HOSPITAL_COMMUNITY): Payer: Medicaid Other

## 2021-09-04 ENCOUNTER — Inpatient Hospital Stay (HOSPITAL_COMMUNITY)
Admission: RE | Admit: 2021-09-04 | Discharge: 2021-09-05 | DRG: 481 | Disposition: A | Discharge status: Home / Self Care | Payer: Medicaid Other | Attending: Student | Admitting: Student

## 2021-09-04 ENCOUNTER — Encounter (HOSPITAL_COMMUNITY)
Admission: RE | Disposition: A | Discharge status: Home / Self Care | Payer: Self-pay | Source: Home / Self Care | Attending: Student

## 2021-09-04 ENCOUNTER — Ambulatory Visit (HOSPITAL_COMMUNITY): Payer: Medicaid Other | Admitting: Anesthesiology

## 2021-09-04 ENCOUNTER — Inpatient Hospital Stay (HOSPITAL_COMMUNITY): Payer: Medicaid Other

## 2021-09-04 ENCOUNTER — Other Ambulatory Visit: Payer: Self-pay

## 2021-09-04 ENCOUNTER — Encounter (HOSPITAL_COMMUNITY): Payer: Self-pay | Admitting: Student

## 2021-09-04 DIAGNOSIS — D62 Acute posthemorrhagic anemia: Secondary | ICD-10-CM | POA: Diagnosis not present

## 2021-09-04 DIAGNOSIS — Z8781 Personal history of (healed) traumatic fracture: Secondary | ICD-10-CM | POA: Diagnosis not present

## 2021-09-04 DIAGNOSIS — S7291XM Unspecified fracture of right femur, subsequent encounter for open fracture type I or II with nonunion: Secondary | ICD-10-CM | POA: Diagnosis present

## 2021-09-04 DIAGNOSIS — Z419 Encounter for procedure for purposes other than remedying health state, unspecified: Secondary | ICD-10-CM

## 2021-09-04 DIAGNOSIS — W3400XD Accidental discharge from unspecified firearms or gun, subsequent encounter: Secondary | ICD-10-CM | POA: Diagnosis not present

## 2021-09-04 DIAGNOSIS — S7290XA Unspecified fracture of unspecified femur, initial encounter for closed fracture: Secondary | ICD-10-CM

## 2021-09-04 DIAGNOSIS — S72361M Displaced segmental fracture of shaft of right femur, subsequent encounter for open fracture type I or II with nonunion: Principal | ICD-10-CM

## 2021-09-04 DIAGNOSIS — F17210 Nicotine dependence, cigarettes, uncomplicated: Secondary | ICD-10-CM | POA: Diagnosis present

## 2021-09-04 DIAGNOSIS — J45909 Unspecified asthma, uncomplicated: Secondary | ICD-10-CM | POA: Diagnosis present

## 2021-09-04 DIAGNOSIS — M96661 Fracture of femur following insertion of orthopedic implant, joint prosthesis, or bone plate, right leg: Secondary | ICD-10-CM

## 2021-09-04 DIAGNOSIS — S72301M Unspecified fracture of shaft of right femur, subsequent encounter for open fracture type I or II with nonunion: Secondary | ICD-10-CM | POA: Diagnosis present

## 2021-09-04 HISTORY — PX: HARDWARE REMOVAL: SHX979

## 2021-09-04 HISTORY — PX: FEMUR IM NAIL: SHX1597

## 2021-09-04 LAB — CBC
HCT: 37 % — ABNORMAL LOW (ref 39.0–52.0)
Hemoglobin: 11.9 g/dL — ABNORMAL LOW (ref 13.0–17.0)
MCH: 26.8 pg (ref 26.0–34.0)
MCHC: 32.2 g/dL (ref 30.0–36.0)
MCV: 83.3 fL (ref 80.0–100.0)
Platelets: 177 10*3/uL (ref 150–400)
RBC: 4.44 MIL/uL (ref 4.22–5.81)
RDW: 13.6 % (ref 11.5–15.5)
WBC: 19.6 10*3/uL — ABNORMAL HIGH (ref 4.0–10.5)
nRBC: 0 % (ref 0.0–0.2)

## 2021-09-04 LAB — VITAMIN D 25 HYDROXY (VIT D DEFICIENCY, FRACTURES): Vit D, 25-Hydroxy: 10.6 ng/mL — ABNORMAL LOW (ref 30–100)

## 2021-09-04 LAB — CREATININE, SERUM
Creatinine, Ser: 0.75 mg/dL (ref 0.61–1.24)
GFR, Estimated: >60 mL/min (ref >60)

## 2021-09-04 SURGERY — INSERTION, INTRAMEDULLARY ROD, FEMUR
Anesthesia: General | Laterality: Right

## 2021-09-04 MED ORDER — CEFAZOLIN SODIUM-DEXTROSE 2-4 GM/100ML-% IV SOLN
2.0000 g | INTRAVENOUS | Status: AC
  Administered 2021-09-04: 2 g via INTRAVENOUS
  Filled 2021-09-04: qty 100

## 2021-09-04 MED ORDER — MIDAZOLAM HCL 5 MG/5ML IJ SOLN
INTRAMUSCULAR | Status: DC | PRN
  Administered 2021-09-04: 2 mg via INTRAVENOUS

## 2021-09-04 MED ORDER — ONDANSETRON HCL 4 MG PO TABS: 4.0000 mg | ORAL_TABLET | Freq: Four times a day (QID) | ORAL | Status: DC | PRN

## 2021-09-04 MED ORDER — LIDOCAINE 2% (20 MG/ML) 5 ML SYRINGE
INTRAMUSCULAR | Status: AC
  Filled 2021-09-04: qty 5

## 2021-09-04 MED ORDER — DOCUSATE SODIUM 100 MG PO CAPS
100.0000 mg | ORAL_CAPSULE | Freq: Two times a day (BID) | ORAL | Status: DC
  Administered 2021-09-04 – 2021-09-05 (×2): 100 mg via ORAL
  Filled 2021-09-04 (×2): qty 1

## 2021-09-04 MED ORDER — PHENYLEPHRINE 40 MCG/ML (10ML) SYRINGE FOR IV PUSH (FOR BLOOD PRESSURE SUPPORT)
PREFILLED_SYRINGE | INTRAVENOUS | Status: DC | PRN
  Administered 2021-09-04 (×3): 80 ug via INTRAVENOUS

## 2021-09-04 MED ORDER — MIDAZOLAM HCL 2 MG/2ML IJ SOLN
INTRAMUSCULAR | Status: AC
  Filled 2021-09-04: qty 2

## 2021-09-04 MED ORDER — OXYCODONE HCL 5 MG PO TABS
10.0000 mg | ORAL_TABLET | ORAL | Status: DC | PRN
  Administered 2021-09-05: 10 mg via ORAL
  Administered 2021-09-05: 15 mg via ORAL
  Administered 2021-09-05: 10 mg via ORAL
  Filled 2021-09-04: qty 2
  Filled 2021-09-04 (×2): qty 3

## 2021-09-04 MED ORDER — ONDANSETRON HCL 4 MG/2ML IJ SOLN
INTRAMUSCULAR | Status: AC
  Filled 2021-09-04: qty 2

## 2021-09-04 MED ORDER — HYDROMORPHONE HCL 1 MG/ML IJ SOLN
INTRAMUSCULAR | Status: AC
  Filled 2021-09-04: qty 1

## 2021-09-04 MED ORDER — HYDRALAZINE HCL 10 MG PO TABS: 10.0000 mg | ORAL_TABLET | Freq: Four times a day (QID) | ORAL | Status: DC | PRN

## 2021-09-04 MED ORDER — METHOCARBAMOL 1000 MG/10ML IJ SOLN
500.0000 mg | Freq: Four times a day (QID) | INTRAVENOUS | Status: DC | PRN
  Filled 2021-09-04: qty 5

## 2021-09-04 MED ORDER — PHENYLEPHRINE 40 MCG/ML (10ML) SYRINGE FOR IV PUSH (FOR BLOOD PRESSURE SUPPORT)
PREFILLED_SYRINGE | INTRAVENOUS | Status: AC
  Filled 2021-09-04: qty 10

## 2021-09-04 MED ORDER — GLYCOPYRROLATE PF 0.2 MG/ML IJ SOSY
PREFILLED_SYRINGE | INTRAMUSCULAR | Status: AC
  Filled 2021-09-04: qty 1

## 2021-09-04 MED ORDER — ROCURONIUM BROMIDE 10 MG/ML (PF) SYRINGE
PREFILLED_SYRINGE | INTRAVENOUS | Status: AC
  Filled 2021-09-04: qty 10

## 2021-09-04 MED ORDER — VANCOMYCIN HCL 1000 MG IV SOLR
INTRAVENOUS | Status: DC | PRN
  Administered 2021-09-04: 1000 mg

## 2021-09-04 MED ORDER — ONDANSETRON HCL 4 MG/2ML IJ SOLN
4.0000 mg | Freq: Four times a day (QID) | INTRAMUSCULAR | Status: DC | PRN
  Administered 2021-09-04: 4 mg via INTRAVENOUS
  Filled 2021-09-04: qty 2

## 2021-09-04 MED ORDER — OXYCODONE HCL 5 MG PO TABS
ORAL_TABLET | ORAL | Status: AC
  Filled 2021-09-04: qty 1

## 2021-09-04 MED ORDER — ACETAMINOPHEN 10 MG/ML IV SOLN
INTRAVENOUS | Status: DC | PRN
Start: 2021-09-04 — End: 2021-09-04
  Administered 2021-09-04: 1000 mg via INTRAVENOUS

## 2021-09-04 MED ORDER — CHLORHEXIDINE GLUCONATE 0.12 % MT SOLN
15.0000 mL | Freq: Once | OROMUCOSAL | Status: AC
  Administered 2021-09-04: 15 mL via OROMUCOSAL
  Filled 2021-09-04: qty 15

## 2021-09-04 MED ORDER — DEXAMETHASONE SODIUM PHOSPHATE 10 MG/ML IJ SOLN
INTRAMUSCULAR | Status: AC
  Filled 2021-09-04: qty 1

## 2021-09-04 MED ORDER — PROPOFOL 10 MG/ML IV BOLUS
INTRAVENOUS | Status: AC
  Filled 2021-09-04: qty 20

## 2021-09-04 MED ORDER — METOCLOPRAMIDE HCL 5 MG PO TABS: 5.0000 mg | ORAL_TABLET | Freq: Three times a day (TID) | ORAL | Status: DC | PRN

## 2021-09-04 MED ORDER — KETAMINE HCL 50 MG/5ML IJ SOSY
PREFILLED_SYRINGE | INTRAMUSCULAR | Status: AC
  Filled 2021-09-04: qty 5

## 2021-09-04 MED ORDER — 0.9 % SODIUM CHLORIDE (POUR BTL) OPTIME
TOPICAL | Status: DC | PRN
  Administered 2021-09-04: 1000 mL

## 2021-09-04 MED ORDER — HYDROMORPHONE HCL 1 MG/ML IJ SOLN
0.2500 mg | INTRAMUSCULAR | Status: DC | PRN
  Administered 2021-09-04 (×4): 0.5 mg via INTRAVENOUS

## 2021-09-04 MED ORDER — ROCURONIUM BROMIDE 10 MG/ML (PF) SYRINGE
PREFILLED_SYRINGE | INTRAVENOUS | Status: DC | PRN
Start: 2021-09-04 — End: 2021-09-04
  Administered 2021-09-04: 60 mg via INTRAVENOUS
  Administered 2021-09-04: 10 mg via INTRAVENOUS

## 2021-09-04 MED ORDER — ENOXAPARIN SODIUM 40 MG/0.4ML IJ SOSY
40.0000 mg | PREFILLED_SYRINGE | INTRAMUSCULAR | Status: DC
  Administered 2021-09-05: 40 mg via SUBCUTANEOUS
  Filled 2021-09-04: qty 0.4

## 2021-09-04 MED ORDER — LIP MEDEX EX OINT
TOPICAL_OINTMENT | CUTANEOUS | Status: DC | PRN
  Filled 2021-09-04: qty 7

## 2021-09-04 MED ORDER — CEFAZOLIN SODIUM-DEXTROSE 2-4 GM/100ML-% IV SOLN
2.0000 g | Freq: Three times a day (TID) | INTRAVENOUS | Status: AC
  Administered 2021-09-04 – 2021-09-05 (×2): 2 g via INTRAVENOUS
  Filled 2021-09-04 (×3): qty 100

## 2021-09-04 MED ORDER — FENTANYL CITRATE (PF) 250 MCG/5ML IJ SOLN
INTRAMUSCULAR | Status: DC | PRN
  Administered 2021-09-04: 100 ug via INTRAVENOUS
  Administered 2021-09-04 (×3): 50 ug via INTRAVENOUS

## 2021-09-04 MED ORDER — ORAL CARE MOUTH RINSE: 15.0000 mL | Freq: Once | OROMUCOSAL | Status: AC

## 2021-09-04 MED ORDER — DEXAMETHASONE SODIUM PHOSPHATE 10 MG/ML IJ SOLN
INTRAMUSCULAR | Status: DC | PRN
Start: 2021-09-04 — End: 2021-09-04
  Administered 2021-09-04: 10 mg via INTRAVENOUS

## 2021-09-04 MED ORDER — POLYETHYLENE GLYCOL 3350 17 G PO PACK: 17.0000 g | PACK | Freq: Every day | ORAL | Status: DC | PRN

## 2021-09-04 MED ORDER — METHOCARBAMOL 500 MG PO TABS
ORAL_TABLET | ORAL | Status: AC
  Filled 2021-09-04: qty 1

## 2021-09-04 MED ORDER — LIDOCAINE 2% (20 MG/ML) 5 ML SYRINGE
INTRAMUSCULAR | Status: DC | PRN
  Administered 2021-09-04: 100 mg via INTRAVENOUS

## 2021-09-04 MED ORDER — BUPIVACAINE-EPINEPHRINE (PF) 0.5% -1:200000 IJ SOLN
INTRAMUSCULAR | Status: DC | PRN
  Administered 2021-09-04: 25 mL via PERINEURAL

## 2021-09-04 MED ORDER — GABAPENTIN 100 MG PO CAPS
100.0000 mg | ORAL_CAPSULE | Freq: Three times a day (TID) | ORAL | Status: DC
  Administered 2021-09-04 – 2021-09-05 (×2): 100 mg via ORAL
  Filled 2021-09-04 (×2): qty 1

## 2021-09-04 MED ORDER — METHOCARBAMOL 500 MG PO TABS
500.0000 mg | ORAL_TABLET | Freq: Four times a day (QID) | ORAL | Status: DC | PRN
  Administered 2021-09-04 – 2021-09-05 (×4): 500 mg via ORAL
  Filled 2021-09-04 (×3): qty 1

## 2021-09-04 MED ORDER — ACETAMINOPHEN 500 MG PO TABS
1000.0000 mg | ORAL_TABLET | Freq: Three times a day (TID) | ORAL | Status: AC
  Administered 2021-09-04 – 2021-09-05 (×4): 1000 mg via ORAL
  Filled 2021-09-04 (×4): qty 2

## 2021-09-04 MED ORDER — METOCLOPRAMIDE HCL 5 MG/ML IJ SOLN: 5.0000 mg | Freq: Three times a day (TID) | INTRAMUSCULAR | Status: DC | PRN

## 2021-09-04 MED ORDER — SODIUM CHLORIDE 0.9 % IV SOLN: INTRAVENOUS | Status: DC

## 2021-09-04 MED ORDER — LACTATED RINGERS IV SOLN: INTRAVENOUS | Status: DC

## 2021-09-04 MED ORDER — KETAMINE HCL-SODIUM CHLORIDE 100-0.9 MG/10ML-% IV SOSY
PREFILLED_SYRINGE | INTRAVENOUS | Status: DC | PRN
  Administered 2021-09-04: 30 mg via INTRAVENOUS

## 2021-09-04 MED ORDER — VANCOMYCIN HCL 1000 MG IV SOLR
INTRAVENOUS | Status: AC
  Filled 2021-09-04: qty 20

## 2021-09-04 MED ORDER — ACETAMINOPHEN 10 MG/ML IV SOLN
INTRAVENOUS | Status: AC
  Filled 2021-09-04: qty 100

## 2021-09-04 MED ORDER — KETOROLAC TROMETHAMINE 15 MG/ML IJ SOLN
15.0000 mg | Freq: Four times a day (QID) | INTRAMUSCULAR | Status: DC
  Administered 2021-09-04: 15 mg via INTRAVENOUS
  Filled 2021-09-04 (×2): qty 1

## 2021-09-04 MED ORDER — GLYCOPYRROLATE PF 0.2 MG/ML IJ SOSY
PREFILLED_SYRINGE | INTRAMUSCULAR | Status: DC | PRN
  Administered 2021-09-04 (×2): .1 mg via INTRAVENOUS

## 2021-09-04 MED ORDER — OXYCODONE HCL 5 MG PO TABS
5.0000 mg | ORAL_TABLET | ORAL | Status: DC | PRN
  Administered 2021-09-04 (×2): 5 mg via ORAL
  Filled 2021-09-04: qty 1

## 2021-09-04 MED ORDER — TRANEXAMIC ACID-NACL 1000-0.7 MG/100ML-% IV SOLN
1000.0000 mg | Freq: Once | INTRAVENOUS | Status: AC
  Administered 2021-09-04: 1000 mg via INTRAVENOUS
  Filled 2021-09-04: qty 100

## 2021-09-04 MED ORDER — HYDROMORPHONE HCL 1 MG/ML IJ SOLN
0.5000 mg | INTRAMUSCULAR | Status: DC | PRN
  Administered 2021-09-04: 1 mg via INTRAVENOUS
  Filled 2021-09-04: qty 1

## 2021-09-04 MED ORDER — PROPOFOL 10 MG/ML IV BOLUS
INTRAVENOUS | Status: DC | PRN
  Administered 2021-09-04: 30 mg via INTRAVENOUS
  Administered 2021-09-04: 170 mg via INTRAVENOUS
  Administered 2021-09-04: 30 mg via INTRAVENOUS

## 2021-09-04 MED ORDER — FENTANYL CITRATE (PF) 250 MCG/5ML IJ SOLN
INTRAMUSCULAR | Status: AC
  Filled 2021-09-04: qty 5

## 2021-09-04 SURGICAL SUPPLY — 87 items
APL PRP STRL LF DISP 70% ISPRP (MISCELLANEOUS) ×2
BAG COUNTER SPONGE SURGICOUNT (BAG) ×3 IMPLANT
BAG SPNG CNTER NS LX DISP (BAG) ×1
BANDAGE ESMARK 6X9 LF (GAUZE/BANDAGES/DRESSINGS) ×2 IMPLANT
BIT DRILL CANN FLEX 14 (BIT) ×1 IMPLANT
BIT DRILL SHORT 4.2 (BIT) IMPLANT
BIT DRILL STEP 4.5X6.5 (BIT) IMPLANT
BLADE SURG 10 STRL SS (BLADE) ×6 IMPLANT
BNDG CMPR 9X6 STRL LF SNTH (GAUZE/BANDAGES/DRESSINGS)
BNDG COHESIVE 6X5 TAN STRL LF (GAUZE/BANDAGES/DRESSINGS) ×3 IMPLANT
BNDG ELASTIC 4X5.8 VLCR STR LF (GAUZE/BANDAGES/DRESSINGS) ×1 IMPLANT
BNDG ELASTIC 6X5.8 VLCR STR LF (GAUZE/BANDAGES/DRESSINGS) ×1 IMPLANT
BNDG ESMARK 6X9 LF (GAUZE/BANDAGES/DRESSINGS)
BNDG GAUZE ELAST 4 BULKY (GAUZE/BANDAGES/DRESSINGS) ×6 IMPLANT
BRUSH SCRUB EZ PLAIN DRY (MISCELLANEOUS) ×6 IMPLANT
CHLORAPREP W/TINT 26 (MISCELLANEOUS) ×4 IMPLANT
COVER SURGICAL LIGHT HANDLE (MISCELLANEOUS) ×6 IMPLANT
CUFF TOURN SGL QUICK 18X4 (TOURNIQUET CUFF) IMPLANT
CUFF TOURN SGL QUICK 24 (TOURNIQUET CUFF)
CUFF TOURN SGL QUICK 34 (TOURNIQUET CUFF)
CUFF TRNQT CYL 24X4X16.5-23 (TOURNIQUET CUFF) IMPLANT
CUFF TRNQT CYL 34X4.125X (TOURNIQUET CUFF) IMPLANT
DRAPE C-ARM 35X43 STRL (DRAPES) ×3 IMPLANT
DRAPE C-ARM 42X72 X-RAY (DRAPES) IMPLANT
DRAPE C-ARMOR (DRAPES) ×3 IMPLANT
DRAPE HALF SHEET 40X57 (DRAPES) ×6 IMPLANT
DRAPE IMP U-DRAPE 54X76 (DRAPES) ×6 IMPLANT
DRAPE INCISE IOBAN 66X45 STRL (DRAPES) ×3 IMPLANT
DRAPE ORTHO SPLIT 77X108 STRL (DRAPES) ×4
DRAPE SURG 17X23 STRL (DRAPES) ×3 IMPLANT
DRAPE SURG ORHT 6 SPLT 77X108 (DRAPES) ×4 IMPLANT
DRAPE U-SHAPE 47X51 STRL (DRAPES) ×3 IMPLANT
DRESSING MEPILEX FLEX 4X4 (GAUZE/BANDAGES/DRESSINGS) IMPLANT
DRILL BIT SHORT 4.2 (BIT) ×4
DRILL BIT STEP 4.5X6.5 (BIT) ×2
DRSG ADAPTIC 3X8 NADH LF (GAUZE/BANDAGES/DRESSINGS) ×2 IMPLANT
DRSG MEPILEX BORDER 4X8 (GAUZE/BANDAGES/DRESSINGS) ×1 IMPLANT
DRSG MEPILEX FLEX 4X4 (GAUZE/BANDAGES/DRESSINGS) ×8
ELECT REM PT RETURN 9FT ADLT (ELECTROSURGICAL) ×2
ELECTRODE REM PT RTRN 9FT ADLT (ELECTROSURGICAL) ×2 IMPLANT
GAUZE SPONGE 4X4 12PLY STRL (GAUZE/BANDAGES/DRESSINGS) ×2 IMPLANT
GLOVE SURG ENC MOIS LTX SZ6.5 (GLOVE) ×9 IMPLANT
GLOVE SURG ENC MOIS LTX SZ7.5 (GLOVE) ×12 IMPLANT
GLOVE SURG UNDER POLY LF SZ6.5 (GLOVE) ×3 IMPLANT
GLOVE SURG UNDER POLY LF SZ7.5 (GLOVE) ×3 IMPLANT
GOWN STRL REUS W/ TWL LRG LVL3 (GOWN DISPOSABLE) ×6 IMPLANT
GOWN STRL REUS W/ TWL XL LVL3 (GOWN DISPOSABLE) ×2 IMPLANT
GOWN STRL REUS W/TWL LRG LVL3 (GOWN DISPOSABLE) ×6
GOWN STRL REUS W/TWL XL LVL3 (GOWN DISPOSABLE) ×2
GUIDEWIRE 3.2X400 (WIRE) ×4 IMPLANT
KIT BASIN OR (CUSTOM PROCEDURE TRAY) ×3 IMPLANT
KIT TURNOVER KIT B (KITS) ×3 IMPLANT
MANIFOLD NEPTUNE II (INSTRUMENTS) ×3 IMPLANT
NAIL TI CANN FRN/GT 12X420MM R (Nail) ×1 IMPLANT
NEEDLE 22X1 1/2 (OR ONLY) (NEEDLE) IMPLANT
NS IRRIG 1000ML POUR BTL (IV SOLUTION) ×3 IMPLANT
PACK GENERAL/GYN (CUSTOM PROCEDURE TRAY) ×3 IMPLANT
PACK ORTHO EXTREMITY (CUSTOM PROCEDURE TRAY) ×3 IMPLANT
PAD ARMBOARD 7.5X6 YLW CONV (MISCELLANEOUS) ×6 IMPLANT
PAD CAST 4YDX4 CTTN HI CHSV (CAST SUPPLIES) IMPLANT
PADDING CAST COTTON 4X4 STRL (CAST SUPPLIES) ×2
PADDING CAST COTTON 6X4 STRL (CAST SUPPLIES) ×8 IMPLANT
REAMER ROD 3.8 BALL TIP 3X950 (ORTHOPEDIC DISPOSABLE SUPPLIES) ×1 IMPLANT
SCREW LOCK IM 5X44 (Screw) ×1 IMPLANT
SCREW LOCK IM 5X46X125 (Spacer) ×1 IMPLANT
SCREW LOCK IM 5X50 (Screw) ×1 IMPLANT
SCREW LOCK IM 5X74 (Screw) ×1 IMPLANT
SPONGE T-LAP 18X18 ~~LOC~~+RFID (SPONGE) ×3 IMPLANT
STAPLER VISISTAT 35W (STAPLE) ×3 IMPLANT
STOCKINETTE IMPERVIOUS LG (DRAPES) ×3 IMPLANT
STRIP CLOSURE SKIN 1/2X4 (GAUZE/BANDAGES/DRESSINGS) IMPLANT
SUCTION FRAZIER HANDLE 10FR (MISCELLANEOUS)
SUCTION TUBE FRAZIER 10FR DISP (MISCELLANEOUS) IMPLANT
SUT ETHILON 3 0 PS 1 (SUTURE) ×6 IMPLANT
SUT MNCRL AB 3-0 PS2 18 (SUTURE) ×3 IMPLANT
SUT MON AB 2-0 CT1 36 (SUTURE) ×3 IMPLANT
SUT PDS AB 2-0 CT1 27 (SUTURE) IMPLANT
SUT VIC AB 0 CT1 27 (SUTURE)
SUT VIC AB 0 CT1 27XBRD ANBCTR (SUTURE) IMPLANT
SUT VIC AB 2-0 CT1 27 (SUTURE) ×4
SUT VIC AB 2-0 CT1 TAPERPNT 27 (SUTURE) ×4 IMPLANT
SYR CONTROL 10ML LL (SYRINGE) IMPLANT
TOWEL GREEN STERILE (TOWEL DISPOSABLE) ×6 IMPLANT
TOWEL GREEN STERILE FF (TOWEL DISPOSABLE) ×6 IMPLANT
TUBE CONNECTING 12X1/4 (SUCTIONS) ×3 IMPLANT
UNDERPAD 30X36 HEAVY ABSORB (UNDERPADS AND DIAPERS) ×3 IMPLANT
YANKAUER SUCT BULB TIP NO VENT (SUCTIONS) ×3 IMPLANT

## 2021-09-04 NOTE — Anesthesia Preprocedure Evaluation (Addendum)
Anesthesia Evaluation  Patient identified by MRN, date of birth, ID band Patient awake  General Assessment Comment:History noted Dr. Chilton Si  Reviewed: Allergy & Precautions, NPO status , Patient's Chart, lab work & pertinent test results  Airway Mallampati: II  TM Distance: >3 FB     Dental   Pulmonary asthma , Current Smoker and Patient abstained from smoking.,    breath sounds clear to auscultation       Cardiovascular negative cardio ROS   Rhythm:Regular Rate:Normal     Neuro/Psych    GI/Hepatic negative GI ROS, Neg liver ROS,   Endo/Other  negative endocrine ROS  Renal/GU negative Renal ROS     Musculoskeletal   Abdominal   Peds  Hematology   Anesthesia Other Findings   Reproductive/Obstetrics                            Anesthesia Physical Anesthesia Plan  ASA: 3  Anesthesia Plan: General   Post-op Pain Management: Regional block   Induction: Intravenous  PONV Risk Score and Plan: Ondansetron, Dexamethasone and Midazolam  Airway Management Planned: Oral ETT  Additional Equipment:   Intra-op Plan:   Post-operative Plan: Extubation in OR  Informed Consent: I have reviewed the patients History and Physical, chart, labs and discussed the procedure including the risks, benefits and alternatives for the proposed anesthesia with the patient or authorized representative who has indicated his/her understanding and acceptance.     Dental advisory given  Plan Discussed with: CRNA and Anesthesiologist  Anesthesia Plan Comments:         Anesthesia Quick Evaluation

## 2021-09-04 NOTE — Transfer of Care (Signed)
Immediate Anesthesia Transfer of Care Note  Patient: Oluwatomisin Deman  Procedure(s) Performed: EXCHANGE INTRAMEDULLARY (IM) NAIL FEMORAL (Right) HARDWARE REMOVAL (Right)  Patient Location: PACU  Anesthesia Type:General  Level of Consciousness: awake, oriented and patient cooperative  Airway & Oxygen Therapy: Patient Spontanous Breathing and Patient connected to nasal cannula oxygen  Post-op Assessment: Report given to RN and Post -op Vital signs reviewed and stable  Post vital signs: Reviewed  Last Vitals:  Vitals Value Taken Time  BP    Temp    Pulse 64 09/04/21 1126  Resp 17 09/04/21 1126  SpO2 100 % 09/04/21 1126  Vitals shown include unvalidated device data.  Last Pain:  Vitals:   09/04/21 0723  TempSrc:   PainSc: 0-No pain      Patients Stated Pain Goal: 0 (09/04/21 0723)  Complications: No notable events documented.

## 2021-09-04 NOTE — Anesthesia Procedure Notes (Signed)
Procedure Name: Intubation Date/Time: 09/04/2021 8:46 AM Performed by: Jenne Campus, CRNA Pre-anesthesia Checklist: Patient identified, Emergency Drugs available, Suction available and Patient being monitored Patient Re-evaluated:Patient Re-evaluated prior to induction Oxygen Delivery Method: Circle System Utilized Preoxygenation: Pre-oxygenation with 100% oxygen Induction Type: IV induction Ventilation: Mask ventilation without difficulty Laryngoscope Size: Miller and 3 Grade View: Grade I Tube type: Oral Tube size: 7.5 mm Number of attempts: 1 Airway Equipment and Method: Stylet and Oral airway Placement Confirmation: ETT inserted through vocal cords under direct vision, positive ETCO2 and breath sounds checked- equal and bilateral Secured at: 22 cm Tube secured with: Tape Dental Injury: Teeth and Oropharynx as per pre-operative assessment
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
## 2021-09-04 NOTE — Interval H&P Note (Signed)
History and Physical Interval Note:  09/04/2021 8:24 AM  David Spears  has presented today for surgery, with the diagnosis of Right femoral nonunion.  The various methods of treatment have been discussed with the patient and family. After consideration of risks, benefits and other options for treatment, the patient has consented to  Procedure(s): EXCHANGE INTRAMEDULLARY (IM) NAIL FEMORAL (Right) HARDWARE REMOVAL (Right) as a surgical intervention.  The patient's history has been reviewed, patient examined, no change in status, stable for surgery.  I have reviewed the patient's chart and labs.  Questions were answered to the patient's satisfaction.     Lennette Bihari P Caily Rakers

## 2021-09-04 NOTE — Anesthesia Postprocedure Evaluation (Signed)
Anesthesia Post Note  Patient: David Spears  Procedure(s) Performed: EXCHANGE INTRAMEDULLARY (IM) NAIL FEMORAL (Right) HARDWARE REMOVAL (Right)     Patient location during evaluation: PACU Anesthesia Type: General Level of consciousness: awake Pain management: pain level controlled Vital Signs Assessment: post-procedure vital signs reviewed and stable Respiratory status: spontaneous breathing Cardiovascular status: stable Postop Assessment: no apparent nausea or vomiting Anesthetic complications: no   No notable events documented.  Last Vitals:  Vitals:   09/04/21 1410 09/04/21 1448  BP: 137/69 134/67  Pulse: 62 (!) 53  Resp: 17 16  Temp: 36.7 C 36.8 C  SpO2: 98% 100%    Last Pain:  Vitals:   09/04/21 1448  TempSrc: Oral  PainSc:                  [name]

## 2021-09-04 NOTE — Anesthesia Procedure Notes (Signed)
Anesthesia Regional Block: Adductor canal block   Pre-Anesthetic Checklist: , timeout performed,  Correct Patient, Correct Site, Correct Laterality,  Correct Procedure, Correct Position, site marked,  Risks and benefits discussed,  Surgical consent,  Pre-op evaluation,  At surgeon's request and post-op pain management  Laterality: Right  Prep: chloraprep       Needles:  Injection technique: Single-shot  Needle Type: Echogenic Stimulator Needle          Additional Needles:   Procedures: Doppler guided,,,, ultrasound used (permanent image in chart),,    Narrative:  Start time: 09/04/2021 8:15 AM End time: 09/04/2021 8:30 AM Injection made incrementally with aspirations every 5 mL. Anesthesiologist: Belinda Block, MD

## 2021-09-04 NOTE — TOC Transition Note (Signed)
Transition of Care [facility]) - CM/SW Discharge Note   Patient Details  Name: Ahmed Inniss MRN: [id number] Date of Birth: Jun 24, 1991  [facility]) CM/SW Contact:  Kingsley Plan, RN Phone Number: 09/04/2021, 3:33 PM   Clinical Narrative:     Await PT and OT evaluations and recommendations.   Patient was going to OP PT on [address] prior to admission per [employer].   Patient was discharged November 2021 received wheel chair, 3 in 1 , tub bench and walker       Transition of Care (TOC) Screening Note   Patient Details  Name: Randy Castrejon Date of Birth: 25-Jan-1991   Transition of Care Department [facility]) has reviewed patient and no TOC needs have been identified at this time. We will continue to monitor patient advancement through interdisciplinary progression rounds. If new patient transition needs arise, please place a TOC consult.     Patient Goals and CMS Choice        Discharge Placement                       Discharge Plan and Services                                     Social Determinants of Health (SDOH) Interventions     Readmission Risk Interventions No flowsheet data found.

## 2021-09-04 NOTE — Progress Notes (Signed)
Accordiong to patient, toradol makes him nauseous. Will hold for tonight

## 2021-09-05 ENCOUNTER — Encounter (HOSPITAL_COMMUNITY): Payer: Self-pay | Admitting: Student

## 2021-09-05 LAB — BASIC METABOLIC PANEL
Anion gap: 6 (ref 5–15)
BUN: 6 mg/dL (ref 6–20)
CO2: 26 mmol/L (ref 22–32)
Calcium: 8.2 mg/dL — ABNORMAL LOW (ref 8.9–10.3)
Chloride: 104 mmol/L (ref 98–111)
Creatinine, Ser: 0.79 mg/dL (ref 0.61–1.24)
GFR, Estimated: >60 mL/min (ref >60)
Glucose, Bld: 114 mg/dL — ABNORMAL HIGH (ref 70–99)
Potassium: 3.7 mmol/L (ref 3.5–5.1)
Sodium: 136 mmol/L (ref 135–145)

## 2021-09-05 LAB — CBC
HCT: 32.6 % — ABNORMAL LOW (ref 39.0–52.0)
Hemoglobin: 10.6 g/dL — ABNORMAL LOW (ref 13.0–17.0)
MCH: 26.8 pg (ref 26.0–34.0)
MCHC: 32.5 g/dL (ref 30.0–36.0)
MCV: 82.5 fL (ref 80.0–100.0)
Platelets: 167 10*3/uL (ref 150–400)
RBC: 3.95 MIL/uL — ABNORMAL LOW (ref 4.22–5.81)
RDW: 13.4 % (ref 11.5–15.5)
WBC: 10.6 10*3/uL — ABNORMAL HIGH (ref 4.0–10.5)
nRBC: 0 % (ref 0.0–0.2)

## 2021-09-05 MED ORDER — VITAMIN D (ERGOCALCIFEROL) 1.25 MG (50000 UNIT) PO CAPS: 50000.0000 [IU] | ORAL_CAPSULE | ORAL | 0 refills | Status: AC

## 2021-09-05 MED ORDER — ACETAMINOPHEN 500 MG PO TABS: 1000.0000 mg | ORAL_TABLET | Freq: Three times a day (TID) | ORAL | 0 refills | Status: AC

## 2021-09-05 MED ORDER — GABAPENTIN 100 MG PO CAPS: 100.0000 mg | ORAL_CAPSULE | Freq: Three times a day (TID) | ORAL | 0 refills | Status: DC

## 2021-09-05 MED ORDER — METHOCARBAMOL 500 MG PO TABS: 500.0000 mg | ORAL_TABLET | Freq: Four times a day (QID) | ORAL | 0 refills | Status: DC | PRN

## 2021-09-05 MED ORDER — OXYCODONE HCL 10 MG PO TABS: 5.0000 mg | ORAL_TABLET | ORAL | 0 refills | Status: DC | PRN

## 2021-09-05 MED ORDER — ASPIRIN EC 81 MG PO TBEC: 81.0000 mg | DELAYED_RELEASE_TABLET | Freq: Every day | ORAL | 0 refills | Status: DC

## 2021-09-05 NOTE — Discharge Instructions (Signed)
Orthopaedic Trauma Service Discharge Instructions   General Discharge Instructions  WEIGHT BEARING STATUS: Weightbearing as tolerated right lower extremity  RANGE OF MOTION/ACTIVITY: Ok for hip and knee motion as tolerated  Wound Care: You may remove your surgical dressings on post op day #2 (Friday 09/06/21). Incisions have been closed with Nylon suture that will need to be removed at your first post-op visit. Incisions can be left open to air if there is no drainage. If incision continues to have drainage, follow wound care instructions below. Okay to shower if no drainage from incisions.  DVT/PE prophylaxis: Aspirin 81 mg daily x 30 days  Diet: as you were eating previously.  Can use over the counter stool softeners and bowel preparations, such as Miralax, to help with bowel movements.  Narcotics can be constipating.  Be sure to drink plenty of fluids  PAIN MEDICATION USE AND EXPECTATIONS  You have likely been given narcotic medications to help control your pain.  After a traumatic event that results in an fracture (broken bone) with or without surgery, it is ok to use narcotic pain medications to help control one's pain.  We understand that everyone responds to pain differently and each individual patient will be evaluated on a regular basis for the continued need for narcotic medications. Ideally, narcotic medication use should last no more than 6-8 weeks (coinciding with fracture healing).   As a patient it is your responsibility as well to monitor narcotic medication use and report the amount and frequency you use these medications when you come to your office visit.   We would also advise that if you are using narcotic medications, you should take a dose prior to therapy to maximize you participation.  IF YOU ARE ON NARCOTIC MEDICATIONS IT IS NOT PERMISSIBLE TO OPERATE A MOTOR VEHICLE (MOTORCYCLE/CAR/TRUCK/MOPED) OR HEAVY MACHINERY DO NOT MIX NARCOTICS WITH OTHER CNS (CENTRAL NERVOUS  SYSTEM) DEPRESSANTS SUCH AS ALCOHOL   STOP SMOKING OR USING NICOTINE PRODUCTS!!!!  As discussed nicotine severely impairs your body's ability to heal surgical and traumatic wounds but also impairs bone healing.  Wounds and bone heal by forming microscopic blood vessels (angiogenesis) and nicotine is a vasoconstrictor (essentially, shrinks blood vessels).  Therefore, if vasoconstriction occurs to these microscopic blood vessels they essentially disappear and are unable to deliver necessary nutrients to the healing tissue.  This is one modifiable factor that you can do to dramatically increase your chances of healing your injury.    (This means no smoking, no nicotine gum, patches, etc)  DO NOT USE NONSTEROIDAL ANTI-INFLAMMATORY DRUGS (NSAID'S)  Using products such as Advil (ibuprofen), Aleve (naproxen), Motrin (ibuprofen) for additional pain control during fracture healing can delay and/or prevent the healing response.  If you would like to take over the counter (OTC) medication, Tylenol (acetaminophen) is ok.  However, some narcotic medications that are given for pain control contain acetaminophen as well. Therefore, you should not exceed more than 4000 mg of tylenol in a day if you do not have liver disease.  Also note that there are may OTC medicines, such as cold medicines and allergy medicines that my contain tylenol as well.  If you have any questions about medications and/or interactions please ask your doctor/PA or your pharmacist.      ICE AND ELEVATE INJURED/OPERATIVE EXTREMITY  Using ice and elevating the injured extremity above your heart can help with swelling and pain control.  Icing in a pulsatile fashion, such as 20 minutes on and 20 minutes off,  can be followed.    Do not place ice directly on skin. Make sure there is a barrier between to skin and the ice pack.    Using frozen items such as frozen peas works well as the conform nicely to the are that needs to be iced.  USE AN ACE WRAP  OR TED HOSE FOR SWELLING CONTROL  In addition to icing and elevation, Ace wraps or TED hose are used to help limit and resolve swelling.  It is recommended to use Ace wraps or TED hose until you are informed to stop.    When using Ace Wraps start the wrapping distally (farthest away from the body) and wrap proximally (closer to the body)   Example: If you had surgery on your leg or thing and you do not have a splint on, start the ace wrap at the toes and work your way up to the thigh        If you had surgery on your upper extremity and do not have a splint on, start the ace wrap at your fingers and work your way up to the upper arm   [place]: [phone]   VISIT OUR WEBSITE FOR ADDITIONAL INFORMATION: [email or web address]    Discharge Wound Care Instructions  Do NOT apply any ointments, solutions or lotions to pin sites or surgical wounds.  These prevent needed drainage and even though solutions like hydrogen peroxide kill bacteria, they also damage cells lining the pin sites that help fight infection.  Applying lotions or ointments can keep the wounds moist and can cause them to breakdown and open up as well. This can increase the risk for infection. When in doubt call the office.  If any drainage is noted, use one layer of adaptic, then gauze, Kerlix, and an ace wrap.  Once the incision is completely dry and without drainage, it may be left open to air out.  Showering may begin 36-48 hours later.  Cleaning gently with soap and water.
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
## 2021-09-05 NOTE — Progress Notes (Signed)
New avs printed for pt with oxycodone pain med listed. Walker and bedside commode delivered to room and taken at discharge.

## 2021-09-05 NOTE — Progress Notes (Signed)
Patient discharged to home with mother via wheelchair. All belongings taken with patient at discharge ( walker and bedside commode)

## 2021-09-05 NOTE — Discharge Summary (Addendum)
Orthopaedic Trauma Service (OTS) Discharge Summary   Patient ID: David Spears MRN: [id number] DOB/AGE: 25-Mar-1991 31 y.o.  Admit date: 09/04/2021 Discharge date: 09/05/2021  Admission Diagnoses: 1. Right healed patella fracture 2. 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  Was taken to the operating room by Dr. Doreatha Martin and underwent the above procedures under general anesthesia.  Patient tolerated procedures well without complications.  Was admitted postoperatively for pain control and therapies.  Started working with physical and occupational therapy starting on postoperative day #1.  Was started on Lovenox for DVT prophylaxis starting on postoperative day #1. On 09/05/2021, the patient was tolerating diet, working well with therapies, pain well controlled, vital signs stable, dressings clean, dry, intact and felt stable for discharge to home. Patient will follow up as below and knows to call with questions or concerns.     Consults: None  Significant Diagnostic Studies:   Results for orders placed or performed during the hospital encounter of 09/04/21 (from the past 168 hour(s))  VITAMIN D 25 Hydroxy (Vit-D Deficiency, Fractures)   Collection Time: 09/04/21  3:04 PM  Result Value Ref Range   Vit D, 25-Hydroxy 10.60 (L) 30 - 100 ng/mL  CBC   Collection Time: 09/04/21  3:04 PM  Result Value Ref Range   WBC 19.6 (H) 4.0 - 10.5 K/uL   RBC 4.44 4.22 -  5.81 MIL/uL   Hemoglobin 11.9 (L) 13.0 - 17.0 g/dL   HCT 37.0 (L) 39.0 - 52.0 %   MCV 83.3 80.0 - 100.0 fL   MCH 26.8 26.0 - 34.0 pg   MCHC 32.2 30.0 - 36.0 g/dL   RDW 13.6 11.5 - 15.5 %   Platelets 177 150 - 400 K/uL   nRBC 0.0 0.0 - 0.2 %  Creatinine, serum   Collection Time: 09/04/21  3:04 PM  Result Value Ref Range   Creatinine, Ser 0.75 0.61 - 1.24 mg/dL   GFR, Estimated >60 >60 mL/min  Basic metabolic panel   Collection Time: 09/05/21  2:59 AM  Result Value Ref Range   Sodium 136 135 - 145 mmol/L   Potassium 3.7 3.5 - 5.1 mmol/L   Chloride 104 98 - 111 mmol/L   CO2 26 22 - 32 mmol/L   Glucose, Bld 114 (H) 70 - 99 mg/dL   BUN 6 6 - 20 mg/dL   Creatinine, Ser 0.79 0.61 - 1.24 mg/dL   Calcium 8.2 (L) 8.9 - 10.3 mg/dL   GFR, Estimated >60 >60 mL/min   Anion gap 6 5 - 15  CBC   Collection Time: 09/05/21  2:59 AM  Result Value Ref Range   WBC 10.6 (H) 4.0 - 10.5 K/uL   RBC 3.95 (L) 4.22 - 5.81 MIL/uL   Hemoglobin 10.6 (L) 13.0 - 17.0 g/dL   HCT 32.6 (L) 39.0 - 52.0 %   MCV 82.5 80.0 - 100.0 fL   MCH 26.8 26.0 - 34.0 pg   MCHC 32.5 30.0 - 36.0 g/dL   RDW  13.4 11.5 - 15.5 %   Platelets 167 150 - 400 K/uL   nRBC 0.0 0.0 - 0.2 %  Results for orders placed or performed during the hospital encounter of 09/02/21 (from the past 168 hour(s))  SARS CORONAVIRUS 2 (TAT 6-24 HRS) Nasopharyngeal Nasopharyngeal Swab   Collection Time: 09/02/21  1:40 PM   Specimen: Nasopharyngeal Swab  Result Value Ref Range   SARS Coronavirus 2 NEGATIVE NEGATIVE  CBC per protocol   Collection Time: 09/02/21  1:43 PM  Result Value Ref Range   WBC 5.3 4.0 - 10.5 K/uL   RBC 5.40 4.22 - 5.81 MIL/uL   Hemoglobin 14.6 13.0 - 17.0 g/dL   HCT 45.6 39.0 - 52.0 %   MCV 84.4 80.0 - 100.0 fL   MCH 27.0 26.0 - 34.0 pg   MCHC 32.0 30.0 - 36.0 g/dL   RDW 13.7 11.5 - 15.5 %   Platelets 200 150 - 400 K/uL   nRBC 0.0 0.0 - 0.2 %  Comprehensive metabolic panel per protocol   Collection Time: 09/02/21  1:43  PM  Result Value Ref Range   Sodium 138 135 - 145 mmol/L   Potassium 4.1 3.5 - 5.1 mmol/L   Chloride 106 98 - 111 mmol/L   CO2 29 22 - 32 mmol/L   Glucose, Bld 104 (H) 70 - 99 mg/dL   BUN 9 6 - 20 mg/dL   Creatinine, Ser 0.91 0.61 - 1.24 mg/dL   Calcium 8.7 (L) 8.9 - 10.3 mg/dL   Total Protein 6.6 6.5 - 8.1 g/dL   Albumin 4.0 3.5 - 5.0 g/dL   AST 18 15 - 41 U/L   ALT 21 0 - 44 U/L   Alkaline Phosphatase 78 38 - 126 U/L   Total Bilirubin 0.6 0.3 - 1.2 mg/dL   GFR, Estimated >60 >60 mL/min   Anion gap 3 (L) 5 - 15     Treatments: IV hydration, antibiotics: Ancef, analgesia: acetaminophen, Dilaudid, and oxycodone, anticoagulation: LMW heparin, therapies: PT and OT, and surgery: As above  Discharge Exam: General: No acute distress Respiratory: No increased work of breathing.  Right lower extremity: Dressings clean, dry, intact.  Endorses sensation throughout extremity.  Neurovascularly intact.  Able to wiggle toes.  Foot warm and well-perfused.  Disposition: Discharge disposition: 01-Home or Self Care        Allergies as of 09/05/2021   No Known Allergies      Medication List     STOP taking these medications    docusate sodium 100 MG capsule Commonly known as: COLACE   ibuprofen 800 MG tablet Commonly known as: ADVIL   oxyCODONE-acetaminophen 5-325 MG tablet Commonly known as: PERCOCET/ROXICET       TAKE these medications    acetaminophen 500 MG tablet Commonly known as: TYLENOL Take 2 tablets (1,000 mg total) by mouth every 8 (eight) hours. What changed: when to take this   aspirin EC 81 MG tablet Take 1 tablet (81 mg total) by mouth daily. Swallow whole. What changed: how much to take   gabapentin 100 MG capsule Commonly known as: NEURONTIN Take 1 capsule (100 mg total) by mouth 3 (three) times daily. What changed:  medication strength how much to take   methocarbamol 500 MG tablet Commonly known as: ROBAXIN Take 1 tablet (500 mg total) by  mouth every 6 (six) hours as needed for muscle spasms. What changed:  how much to take when to take this reasons to take this   Oxycodone  HCl 10 MG Tabs Take 0.5-1 tablets (5-10 mg total) by mouth every 4 (four) hours as needed for severe pain or moderate pain (pain score 4-6). What changed:  medication strength when to take this reasons to take this   Vitamin D (Ergocalciferol) 1.25 MG (50000 UNIT) Caps capsule Commonly known as: DRISDOL Take 1 capsule (50,000 Units total) by mouth every 7 (seven) days.               Durable Medical Equipment  (From admission, onward)           Start     Ordered   09/05/21 1210  For home use only DME 3 n 1  Once        09/05/21 1209   09/05/21 1210  For home use only DME 4 wheeled rolling walker with seat  Once       Question:  Patient needs a walker to treat with the following condition  Answer:  Weakness   09/05/21 1209            Follow-up Information     Haddix, Thomasene Lot, MD. Schedule an appointment as soon as possible for a visit in 2 week(s).   Specialty: Orthopedic Surgery Why: for repeat x-rays, suture removal Contact information: [address] [place] [address] [phone]                 Discharge Instructions and Plan: Patient will be discharged to home.  Will remain weightbearing as tolerated on the right lower extremity.  No range of motion restrictions of the hip or knee.. Will be discharged on Aspirin 81 mg daily x30 days for DVT prophylaxis. Patient has been provided with all the necessary DME for discharge. Patient will follow up with Dr. Doreatha Martin in 2 weeks for repeat x-rays and suture removal.   Signed:  Gwinda Passe, PA-C ?[phone] (phone) 09/05/2021, 4:29 PM  [facility] [place] [address] [phone] Domingo Sep (F)
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
## 2021-09-05 NOTE — Progress Notes (Signed)
Orthopaedic Trauma Progress Note  SUBJECTIVE: Sleeping comfortably this morning.  Pain controlled currently.  No chest pain. No SOB. No nausea/vomiting. No other complaints.  Wife in bed with patient  OBJECTIVE:  Vitals:   09/05/21 0128 09/05/21 0318  BP: 111/62 112/68  Pulse: 65 73  Resp: 17 18  Temp: 99.4 F (37.4 C) 98.1 F (36.7 C)  SpO2: 100% 99%    General: Resting comfortably in bed in prone position, no acute distress Respiratory: No increased work of breathing.  Right lower extremity: Dressings clean, dry, intact.  Endorses sensation throughout extremity.  Neurovascularly intact.  Able to wiggle toes.  Foot warm and well-perfused.  IMAGING: Stable post op imaging.   LABS:  Results for orders placed or performed during the hospital encounter of 09/04/21 (from the past 24 hour(s))  VITAMIN D 25 Hydroxy (Vit-D Deficiency, Fractures)     Status: Abnormal   Collection Time: 09/04/21  3:04 PM  Result Value Ref Range   Vit D, 25-Hydroxy 10.60 (L) 30 - 100 ng/mL  CBC     Status: Abnormal   Collection Time: 09/04/21  3:04 PM  Result Value Ref Range   WBC 19.6 (H) 4.0 - 10.5 K/uL   RBC 4.44 4.22 - 5.81 MIL/uL   Hemoglobin 11.9 (L) 13.0 - 17.0 g/dL   HCT 16.1 (L) 09.6 - 04.5 %   MCV 83.3 80.0 - 100.0 fL   MCH 26.8 26.0 - 34.0 pg   MCHC 32.2 30.0 - 36.0 g/dL   RDW 40.9 81.1 - 91.4 %   Platelets 177 150 - 400 K/uL   nRBC 0.0 0.0 - 0.2 %  Creatinine, serum     Status: None   Collection Time: 09/04/21  3:04 PM  Result Value Ref Range   Creatinine, Ser 0.75 0.61 - 1.24 mg/dL   GFR, Estimated >78 >29 mL/min  Basic metabolic panel     Status: Abnormal   Collection Time: 09/05/21  2:59 AM  Result Value Ref Range   Sodium 136 135 - 145 mmol/L   Potassium 3.7 3.5 - 5.1 mmol/L   Chloride 104 98 - 111 mmol/L   CO2 26 22 - 32 mmol/L   Glucose, Bld 114 (H) 70 - 99 mg/dL   BUN 6 6 - 20 mg/dL   Creatinine, Ser 5.62 0.61 - 1.24 mg/dL   Calcium 8.2 (L) 8.9 - 10.3 mg/dL   GFR,  Estimated >13 >08 mL/min   Anion gap 6 5 - 15  CBC     Status: Abnormal   Collection Time: 09/05/21  2:59 AM  Result Value Ref Range   WBC 10.6 (H) 4.0 - 10.5 K/uL   RBC 3.95 (L) 4.22 - 5.81 MIL/uL   Hemoglobin 10.6 (L) 13.0 - 17.0 g/dL   HCT 65.7 (L) 84.6 - 96.2 %   MCV 82.5 80.0 - 100.0 fL   MCH 26.8 26.0 - 34.0 pg   MCHC 32.5 30.0 - 36.0 g/dL   RDW 95.2 84.1 - 32.4 %   Platelets 167 150 - 400 K/uL   nRBC 0.0 0.0 - 0.2 %    ASSESSMENT: David Spears is a 31 y.o. male, 1 Day Post-Op s/p EXCHANGE INTRAMEDULLARY (IM) NAIL FEMORAL HARDWARE REMOVAL  CV/Blood loss: Acute blood loss anemia, Hgb 10.6 this AM. Hemodynamically stable  PLAN: Weightbearing: WBAT RLE ROM: Unrestricted hip and knee motion as tolerated Incisional and dressing care: OK to remove dressings 09/06/2021 and leave open to air with dry gauze PRN  Showering:  Okay to begin showering 09/07/2021 if no drainage from incisions Orthopedic device(s): None  Pain management:  1. Tylenol 1000 mg q 8 hours scheduled 2. Robaxin 500 mg q 6 hours PRN 3. Oxycodone 5-15 mg q 4 hours PRN 4. Neurontin 100 mg TID 5. Dilaudid 0.5-1 mg q 4 hours PRN VTE prophylaxis: Lovenox, SCDs ID:  Ancef 2gm post op Foley/Lines:  No foley, KVO IVFs Impediments to Fracture Healing: Vitamin D level 10. Will start on D2 supplementation q. 7 days Dispo: PT/OT evaluation today.  Plan for discharge home later this afternoon versus tomorrow pending pain control and mobility.  Plan to transition to aspirin 81 mg for DVT prophylaxis at discharge. Follow - up plan: 2 weeks  Contact information:  Truitt Merle MD, Thyra Breed PA-C. After hours and holidays please check [email or web address] for group call information for [facility]   Thompson Caul, PA-C [phone] (office) [email or web address]
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
## 2021-09-05 NOTE — Progress Notes (Signed)
Mobility Specialist Progress Note   09/05/21 1535  Mobility  Activity Ambulated in hall  Level of Assistance Modified independent, requires aide device or extra time  Assistive Device Four wheel walker  RLE Weight Bearing WBAT  Distance Ambulated (ft) 550 ft  Mobility Ambulated independently in hallway  Mobility Response Tolerated well  Mobility performed by Mobility specialist  $Mobility charge 1 Mobility   Received pt in bed having no complaints and agreeable to mobility. Asymptomatic throughout ambulation, returned back to EOB w/ call bell in reach and all needs met.   Holland Falling Mobility Specialist Phone Number [phone]
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
## 2022-04-11 ENCOUNTER — Ambulatory Visit: Payer: Medicaid Other | Admitting: Physical Medicine & Rehabilitation

## 2022-04-22 ENCOUNTER — Encounter: Payer: Medicaid Other | Attending: Physical Medicine & Rehabilitation | Admitting: Physical Medicine & Rehabilitation

## 2022-05-30 ENCOUNTER — Other Ambulatory Visit: Payer: Self-pay | Admitting: *Deleted

## 2022-05-30 DIAGNOSIS — T148XXA Other injury of unspecified body region, initial encounter: Secondary | ICD-10-CM

## 2022-05-30 DIAGNOSIS — W3400XA Accidental discharge from unspecified firearms or gun, initial encounter: Secondary | ICD-10-CM

## 2022-05-30 DIAGNOSIS — Z48812 Encounter for surgical aftercare following surgery on the circulatory system: Secondary | ICD-10-CM

## 2022-06-05 ENCOUNTER — Ambulatory Visit: Payer: Medicaid Other

## 2022-06-05 ENCOUNTER — Ambulatory Visit (HOSPITAL_COMMUNITY): Admission: RE | Admit: 2022-06-05 | Payer: Medicaid Other | Source: Ambulatory Visit

## 2022-06-05 ENCOUNTER — Ambulatory Visit (HOSPITAL_COMMUNITY): Payer: Medicaid Other | Attending: Vascular Surgery
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
## 2022-06-18 NOTE — Progress Notes (Unsigned)
Established Previous Bypass   History of Present Illness   David Spears is a 31 y.o. (December 17, 1990) male who presents with chief complaint: ***.    Prior procedures include:  ***   The patient's symptoms have *** progressed.  The patient's symptoms are: ***. The patient's treatment regimen currently included: maximal medical management ***and ***.  The patient's PMH, PSH, SH, and FamHx were reviewed on *** are unchanged from ***.  Current Outpatient Medications  Medication Sig Dispense Refill   acetaminophen (TYLENOL) 500 MG tablet Take 2 tablets (1,000 mg total) by mouth every 8 (eight) hours. 60 tablet 0   aspirin EC 81 MG tablet Take 1 tablet (81 mg total) by mouth daily. Swallow whole. 30 tablet 0   gabapentin (NEURONTIN) 100 MG capsule Take 1 capsule (100 mg total) by mouth 3 (three) times daily. 30 capsule 0   methocarbamol (ROBAXIN) 500 MG tablet Take 1 tablet (500 mg total) by mouth every 6 (six) hours as needed for muscle spasms. 28 tablet 0   oxyCODONE (OXY IR/ROXICODONE) 5 MG immediate release tablet Take 5 mg by mouth every 6 (six) hours as needed.     oxyCODONE 10 MG TABS Take 0.5-1 tablets (5-10 mg total) by mouth every 4 (four) hours as needed for severe pain or moderate pain (pain score 4-6). 42 tablet 0   Vitamin D, Ergocalciferol, (DRISDOL) 1.25 MG (50000 UNIT) CAPS capsule Take 1 capsule (50,000 Units total) by mouth every 7 (seven) days. 8 capsule 0   No current facility-administered medications for this visit.    ***REVIEW OF SYSTEMS (negative unless checked):   Cardiac:  []  Chest pain or chest pressure? []  Shortness of breath upon activity? []  Shortness of breath when lying flat? []  Irregular heart rhythm?  Vascular:  []  Pain in calf, thigh, or hip brought on by walking? []  Pain in feet at night that wakes you up from your sleep? []  Blood clot in your veins? []  Leg swelling?  Pulmonary:  []  Oxygen at home? []  Productive cough? []   Wheezing?  Neurologic:  []  Sudden weakness in arms or legs? []  Sudden numbness in arms or legs? []  Sudden onset of difficult speaking or slurred speech? []  Temporary loss of vision in one eye? []  Problems with dizziness?  Gastrointestinal:  []  Blood in stool? []  Vomited blood?  Genitourinary:  []  Burning when urinating? []  Blood in urine?  Psychiatric:  []  Major depression  Hematologic:  []  Bleeding problems? []  Problems with blood clotting?  Dermatologic:  []  Rashes or ulcers?  Constitutional:  []  Fever or chills?  Ear/Nose/Throat:  []  Change in hearing? []  Nose bleeds? []  Sore throat?  Musculoskeletal:  []  Back pain? []  Joint pain? []  Muscle pain?   Physical Examination  ***There were no vitals filed for this visit. ***There is no height or weight on file to calculate BMI.  General:  WDWN in NAD; vital signs documented above Gait: Not observed HENT: WNL, normocephalic Pulmonary: normal non-labored breathing , without Rales, rhonchi,  wheezing Cardiac: {Desc; regular/irreg:14544} HR, without  Murmurs {With/Without:20273} carotid bruit*** Abdomen: soft, NT, no masses Skin: {With/Without:20273} rashes Vascular Exam/Pulses:  Right Left  Radial {Exam; arterial pulse strength 0-4:30167} {Exam; arterial pulse strength 0-4:30167}  Ulnar {Exam; arterial pulse strength 0-4:30167} {Exam; arterial pulse strength 0-4:30167}  Femoral {Exam; arterial pulse strength 0-4:30167} {Exam; arterial pulse strength 0-4:30167}  Popliteal {Exam; arterial pulse strength 0-4:30167} {Exam; arterial pulse strength 0-4:30167}  DP {Exam; arterial pulse strength 0-4:30167} {Exam; arterial pulse strength  0-4:30167}  PT {Exam; arterial pulse strength 0-4:30167} {Exam; arterial pulse strength 0-4:30167}   Extremities: {With/Without:20273} ischemic changes, {With/Without:20273} Gangrene , {With/Without:20273} cellulitis; {With/Without:20273} open wounds;  Musculoskeletal: no muscle wasting  or atrophy  Neurologic: A&O X 3;  No focal weakness or paresthesias are detected Psychiatric:  The pt has {Desc; normal/abnormal:11317::"Normal"} affect.  Non-Invasive Vascular Imaging ABI (***) R:  ABI: *** (***),  PT: {Signals:19197::"none","mono","bi","tri"} DP: {Signals:19197::"none","mono","bi","tri"} TBI:  *** L:  ABI: *** (***),  PT: {Signals:19197::"none","mono","bi","tri"} DP: {Signals:19197::"none","mono","bi","tri"} TBI: ***  Aortoiliac Duplex (***) Ao: *** c/s R iliac: *** c/s L iliac: *** c/s  Bypass Duplex (***) Proximal to bypass: *** c/s With-in bypass: *** c/s Distal to bypass: *** c/s   Medical Decision Making   David Spears is a 31 y.o. male who presents with: *** s/p *** .  Based on the patient's vascular studies and examination, I have offered the patient: ***BLE ABI, ***bypass duplex, and ***aortoiliac duplex in *** months. I discussed in depth with the patient the nature of atherosclerosis, and emphasized the importance of maximal medical management including strict control of blood pressure, blood glucose, and lipid levels, obtaining regular exercise, and cessation of smoking.   The patient is aware that without maximal medical management the underlying atherosclerotic disease process will progress, limiting the benefit of any interventions. I discussed in depth with the patient the need for long term surveillance to improve the primary assisted patency of his bypass.  The patient agrees to cooperate with such. The patient is currently {Statin:19197::"not on on statin as not medically indicated.","not on a statin due to reported allergy.","not on a statin. Patient will be started on Lipitor 10 mg PO daily, to be titrated and managed by their primary physician.","on a statin: Mevacor.","on a statin: Zocor.","on a statin: Lipitor.","on a statin: Pravachol.","on a statin: Crestor.","on a statin: ***."}  The patient is currently {Antiplatelet:19197::"not on  an anti-platelet due to allergy.","not on an anti-platelet due to bleeding risks related to use of an anticoagulant.","not on an anti-platelet. Patient will be started on ASA 81 mg PO daily","on an anti-platelet: ASA.","on an anti-platelet: Plavix.","on an anti-platelet: ASA and Plavix.","on an anti-platelet: ***."} Thank you for allowing [place] to participate in this patient's care.   Emilie Rutter PA-C [facility] [place] Office: [phone]  Clinic MD: ***

## 2022-06-19 ENCOUNTER — Ambulatory Visit: Payer: Self-pay | Admitting: Physician Assistant

## 2022-06-19 ENCOUNTER — Ambulatory Visit (HOSPITAL_COMMUNITY): Payer: Medicaid Other

## 2022-06-19 ENCOUNTER — Ambulatory Visit (HOSPITAL_COMMUNITY): Payer: Medicaid Other | Attending: Vascular Surgery

## 2022-06-25 NOTE — Progress Notes (Signed)
No show

## 2022-08-26 ENCOUNTER — Other Ambulatory Visit: Payer: Self-pay | Admitting: *Deleted

## 2022-08-26 DIAGNOSIS — T148XXA Other injury of unspecified body region, initial encounter: Secondary | ICD-10-CM

## 2022-08-26 DIAGNOSIS — Z48812 Encounter for surgical aftercare following surgery on the circulatory system: Secondary | ICD-10-CM

## 2022-08-28 ENCOUNTER — Ambulatory Visit (INDEPENDENT_AMBULATORY_CARE_PROVIDER_SITE_OTHER): Payer: Medicaid Other | Admitting: Physician Assistant

## 2022-08-28 ENCOUNTER — Encounter: Payer: Self-pay | Admitting: Physician Assistant

## 2022-08-28 ENCOUNTER — Ambulatory Visit (HOSPITAL_COMMUNITY)
Admission: RE | Admit: 2022-08-28 | Discharge: 2022-08-28 | Disposition: A | Discharge status: Home / Self Care | Payer: Medicaid Other | Source: Ambulatory Visit | Attending: Vascular Surgery | Admitting: Vascular Surgery

## 2022-08-28 ENCOUNTER — Ambulatory Visit (INDEPENDENT_AMBULATORY_CARE_PROVIDER_SITE_OTHER)
Admission: RE | Admit: 2022-08-28 | Discharge: 2022-08-28 | Disposition: A | Discharge status: Home / Self Care | Payer: Medicaid Other | Source: Ambulatory Visit | Attending: Vascular Surgery | Admitting: Vascular Surgery

## 2022-08-28 VITALS — BP 129/81 | HR 57 | Temp 98.4°F | Resp 20 | Ht 74.0 in | Wt 181.5 lb

## 2022-08-28 DIAGNOSIS — Z48812 Encounter for surgical aftercare following surgery on the circulatory system: Secondary | ICD-10-CM | POA: Insufficient documentation

## 2022-08-28 DIAGNOSIS — T148XXA Other injury of unspecified body region, initial encounter: Secondary | ICD-10-CM | POA: Insufficient documentation

## 2022-08-28 DIAGNOSIS — Y249XXA Unspecified firearm discharge, undetermined intent, initial encounter: Secondary | ICD-10-CM

## 2022-08-28 DIAGNOSIS — W3400XA Accidental discharge from unspecified firearms or gun, initial encounter: Secondary | ICD-10-CM | POA: Diagnosis not present
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
## 2022-12-30 ENCOUNTER — Other Ambulatory Visit (INDEPENDENT_AMBULATORY_CARE_PROVIDER_SITE_OTHER): Payer: Self-pay | Admitting: Nurse Practitioner

## 2022-12-30 DIAGNOSIS — R200 Anesthesia of skin: Secondary | ICD-10-CM

## 2022-12-30 DIAGNOSIS — Z9889 Other specified postprocedural states: Secondary | ICD-10-CM

## 2023-01-02 ENCOUNTER — Encounter (INDEPENDENT_AMBULATORY_CARE_PROVIDER_SITE_OTHER): Payer: Self-pay

## 2023-01-02 ENCOUNTER — Encounter (INDEPENDENT_AMBULATORY_CARE_PROVIDER_SITE_OTHER): Payer: Self-pay | Admitting: Nurse Practitioner

## 2023-01-02 ENCOUNTER — Encounter (INDEPENDENT_AMBULATORY_CARE_PROVIDER_SITE_OTHER): Payer: Medicaid Other

## 2023-02-06 ENCOUNTER — Encounter (INDEPENDENT_AMBULATORY_CARE_PROVIDER_SITE_OTHER): Payer: Medicaid Other

## 2023-02-06 ENCOUNTER — Encounter (INDEPENDENT_AMBULATORY_CARE_PROVIDER_SITE_OTHER): Payer: Self-pay

## 2023-02-06 ENCOUNTER — Encounter (INDEPENDENT_AMBULATORY_CARE_PROVIDER_SITE_OTHER): Payer: Self-pay | Admitting: Nurse Practitioner

## 2023-02-22 ENCOUNTER — Ambulatory Visit
Admission: EM | Admit: 2023-02-22 | Discharge: 2023-02-22 | Disposition: A | Discharge status: Home / Self Care | Payer: Medicaid Other | Attending: Internal Medicine | Admitting: Internal Medicine

## 2023-02-22 ENCOUNTER — Encounter: Payer: Self-pay | Admitting: Emergency Medicine

## 2023-02-22 DIAGNOSIS — S61211A Laceration without foreign body of left index finger without damage to nail, initial encounter: Secondary | ICD-10-CM | POA: Diagnosis not present

## 2023-02-22 DIAGNOSIS — S61210A Laceration without foreign body of right index finger without damage to nail, initial encounter: Secondary | ICD-10-CM
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
## 2023-02-22 NOTE — ED Triage Notes (Signed)
Pt cut his right pointer finger on the flat part of finger on glass today. Bleeding is controlled

## 2023-02-22 NOTE — Discharge Instructions (Signed)
Please keep covered until healed over.  Monitor for signs of infection that include increased redness, swelling, pus.  Follow-up sooner if this happens.

## 2023-03-05 ENCOUNTER — Emergency Department (HOSPITAL_COMMUNITY)
Admission: EM | Admit: 2023-03-05 | Discharge: 2023-03-06 | Disposition: A | Discharge status: Home / Self Care | Payer: MEDICAID | Attending: Emergency Medicine | Admitting: Emergency Medicine

## 2023-03-05 ENCOUNTER — Emergency Department (HOSPITAL_COMMUNITY): Payer: MEDICAID

## 2023-03-05 ENCOUNTER — Encounter (HOSPITAL_COMMUNITY): Payer: Self-pay

## 2023-03-05 ENCOUNTER — Other Ambulatory Visit: Payer: Self-pay

## 2023-03-05 DIAGNOSIS — S01412A Laceration without foreign body of left cheek and temporomandibular area, initial encounter: Secondary | ICD-10-CM | POA: Diagnosis not present

## 2023-03-05 DIAGNOSIS — F17210 Nicotine dependence, cigarettes, uncomplicated: Secondary | ICD-10-CM | POA: Insufficient documentation

## 2023-03-05 DIAGNOSIS — Z7982 Long term (current) use of aspirin: Secondary | ICD-10-CM | POA: Diagnosis not present

## 2023-03-05 DIAGNOSIS — S91331A Puncture wound without foreign body, right foot, initial encounter: Secondary | ICD-10-CM

## 2023-03-05 DIAGNOSIS — S0240FA Zygomatic fracture, left side, initial encounter for closed fracture: Secondary | ICD-10-CM

## 2023-03-05 DIAGNOSIS — S91301A Unspecified open wound, right foot, initial encounter: Secondary | ICD-10-CM | POA: Diagnosis not present

## 2023-03-05 DIAGNOSIS — S0993XA Unspecified injury of face, initial encounter: Secondary | ICD-10-CM | POA: Diagnosis present

## 2023-03-05 DIAGNOSIS — J45909 Unspecified asthma, uncomplicated: Secondary | ICD-10-CM | POA: Insufficient documentation

## 2023-03-05 DIAGNOSIS — Z79899 Other long term (current) drug therapy: Secondary | ICD-10-CM | POA: Diagnosis not present

## 2023-03-05 DIAGNOSIS — W3400XA Accidental discharge from unspecified firearms or gun, initial encounter: Secondary | ICD-10-CM | POA: Diagnosis not present

## 2023-03-05 LAB — COMPREHENSIVE METABOLIC PANEL WITH GFR
ALT: 13 U/L (ref 0–44)
AST: 16 U/L (ref 15–41)
Albumin: 3.4 g/dL — ABNORMAL LOW (ref 3.5–5.0)
Alkaline Phosphatase: 63 U/L (ref 38–126)
Anion gap: 14 (ref 5–15)
BUN: 8 mg/dL (ref 6–20)
CO2: 24 mmol/L (ref 22–32)
Calcium: 8.8 mg/dL — ABNORMAL LOW (ref 8.9–10.3)
Chloride: 101 mmol/L (ref 98–111)
Creatinine, Ser: 0.91 mg/dL (ref 0.61–1.24)
GFR, Estimated: >60 mL/min (ref >60)
Glucose, Bld: 123 mg/dL — ABNORMAL HIGH (ref 70–99)
Potassium: 3.3 mmol/L — ABNORMAL LOW (ref 3.5–5.1)
Sodium: 139 mmol/L (ref 135–145)
Total Bilirubin: 1.2 mg/dL (ref 0.3–1.2)
Total Protein: 6.1 g/dL — ABNORMAL LOW (ref 6.5–8.1)

## 2023-03-05 LAB — PROTIME-INR
INR: 1.1 (ref 0.8–1.2)
Prothrombin Time: 14.4 seconds (ref 11.4–15.2)

## 2023-03-05 LAB — CBC
HCT: 40.4 % (ref 39.0–52.0)
Hemoglobin: 13 g/dL (ref 13.0–17.0)
MCH: 26.9 pg (ref 26.0–34.0)
MCHC: 32.2 g/dL (ref 30.0–36.0)
MCV: 83.6 fL (ref 80.0–100.0)
Platelets: 221 K/uL (ref 150–400)
RBC: 4.83 MIL/uL (ref 4.22–5.81)
RDW: 13.3 % (ref 11.5–15.5)
WBC: 6.8 K/uL (ref 4.0–10.5)
nRBC: 0 % (ref 0.0–0.2)

## 2023-03-05 LAB — SAMPLE TO BLOOD BANK

## 2023-03-05 LAB — I-STAT CHEM 8, ED
BUN: 8 mg/dL (ref 6–20)
Calcium, Ion: 1.09 mmol/L — ABNORMAL LOW (ref 1.15–1.40)
Chloride: 103 mmol/L (ref 98–111)
Creatinine, Ser: 0.8 mg/dL (ref 0.61–1.24)
Glucose, Bld: 118 mg/dL — ABNORMAL HIGH (ref 70–99)
HCT: 39 % (ref 39.0–52.0)
Hemoglobin: 13.3 g/dL (ref 13.0–17.0)
Potassium: 3.2 mmol/L — ABNORMAL LOW (ref 3.5–5.1)
Sodium: 140 mmol/L (ref 135–145)
TCO2: 22 mmol/L (ref 22–32)

## 2023-03-05 LAB — ETHANOL: Alcohol, Ethyl (B): <10 mg/dL (ref <10)

## 2023-03-05 MED ORDER — LIDOCAINE HCL (PF) 1 % IJ SOLN
5.0000 mL | Freq: Once | INTRAMUSCULAR | Status: AC
  Administered 2023-03-05: 5 mL via INTRADERMAL
  Filled 2023-03-05: qty 5

## 2023-03-05 MED ORDER — POTASSIUM CHLORIDE CRYS ER 20 MEQ PO TBCR
40.0000 meq | EXTENDED_RELEASE_TABLET | Freq: Once | ORAL | Status: AC
  Administered 2023-03-06: 40 meq via ORAL
  Filled 2023-03-05: qty 2

## 2023-03-05 MED ORDER — OXYCODONE-ACETAMINOPHEN 5-325 MG PO TABS
2.0000 | ORAL_TABLET | Freq: Once | ORAL | Status: AC
  Administered 2023-03-06: 2 via ORAL
  Filled 2023-03-05: qty 2

## 2023-03-05 NOTE — Progress Notes (Signed)
Orthopedic Tech Progress Note Patient Details:  David Spears 1991-04-26 [id number]  Ortho Devices Type of Ortho Device: Short leg splint, Crutches Ortho Device/Splint Location: RLE Ortho Device/Splint Interventions: Ordered, Application, Adjustment   Post Interventions Patient Tolerated: Well Instructions Provided: Adjustment of device, Care of device, Poper ambulation with device  [name] [name] 03/05/2023, 11:41 PM
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
## 2023-03-05 NOTE — ED Notes (Signed)
[place] PD at bedside 
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
## 2023-03-06 ENCOUNTER — Encounter (HOSPITAL_COMMUNITY): Payer: Self-pay | Admitting: Emergency Medicine

## 2023-03-06 MED ORDER — CEPHALEXIN 500 MG PO CAPS: 500.0000 mg | ORAL_CAPSULE | Freq: Four times a day (QID) | ORAL | 0 refills | Status: DC

## 2023-03-06 NOTE — ED Notes (Signed)
Discharge instructions reviewed with pt and sister per his request.   Opportunity for questions and concerns provided.   Encouraged to follow up with referrals within a week.   Suture care and fracture care and ortho device care provided.   Alert, oriented and escorted in wheelchair on departure.

## 2023-03-08 ENCOUNTER — Other Ambulatory Visit: Payer: Self-pay

## 2023-03-08 ENCOUNTER — Emergency Department (HOSPITAL_COMMUNITY)
Admission: EM | Admit: 2023-03-08 | Discharge: 2023-03-08 | Disposition: A | Discharge status: Home / Self Care | Payer: MEDICAID | Attending: Emergency Medicine | Admitting: Emergency Medicine

## 2023-03-08 ENCOUNTER — Emergency Department (HOSPITAL_COMMUNITY): Payer: MEDICAID

## 2023-03-08 ENCOUNTER — Ambulatory Visit (HOSPITAL_COMMUNITY)
Admission: EM | Admit: 2023-03-08 | Discharge: 2023-03-08 | Disposition: A | Discharge status: Home / Self Care | Payer: MEDICAID

## 2023-03-08 DIAGNOSIS — M79671 Pain in right foot: Secondary | ICD-10-CM | POA: Diagnosis not present

## 2023-03-08 DIAGNOSIS — S61401A Unspecified open wound of right hand, initial encounter: Secondary | ICD-10-CM | POA: Insufficient documentation

## 2023-03-08 DIAGNOSIS — T07XXXA Unspecified multiple injuries, initial encounter: Secondary | ICD-10-CM

## 2023-03-08 DIAGNOSIS — J45909 Unspecified asthma, uncomplicated: Secondary | ICD-10-CM | POA: Insufficient documentation

## 2023-03-08 DIAGNOSIS — W3400XA Accidental discharge from unspecified firearms or gun, initial encounter: Secondary | ICD-10-CM | POA: Diagnosis not present

## 2023-03-08 DIAGNOSIS — G8929 Other chronic pain: Secondary | ICD-10-CM

## 2023-03-08 DIAGNOSIS — M79641 Pain in right hand: Secondary | ICD-10-CM

## 2023-03-08 DIAGNOSIS — S6991XA Unspecified injury of right wrist, hand and finger(s), initial encounter: Secondary | ICD-10-CM | POA: Diagnosis present

## 2023-03-08 MED ORDER — ACETAMINOPHEN 500 MG PO TABS
1000.0000 mg | ORAL_TABLET | Freq: Once | ORAL | Status: AC
  Administered 2023-03-08: 1000 mg via ORAL
  Filled 2023-03-08: qty 2

## 2023-03-08 MED ORDER — OXYCODONE HCL 5 MG PO TABS
10.0000 mg | ORAL_TABLET | Freq: Once | ORAL | Status: AC
  Administered 2023-03-08: 10 mg via ORAL
  Filled 2023-03-08: qty 2

## 2023-03-08 MED ORDER — OXYCODONE-ACETAMINOPHEN 5-325 MG PO TABS: 1.0000 | ORAL_TABLET | Freq: Four times a day (QID) | ORAL | 0 refills | Status: DC | PRN

## 2023-03-08 MED ORDER — OXYCODONE-ACETAMINOPHEN 5-325 MG PO TABS: 1.0000 | ORAL_TABLET | ORAL | 0 refills | Status: AC | PRN
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
## 2023-03-08 NOTE — ED Notes (Signed)
Patient is being discharged from the Urgent Care and sent to the Emergency Department via POV . Per Para March, NP, patient is in need of higher level of care due to limited resources . Patient is aware and verbalizes understanding of plan of care.  Vitals:   03/08/23 1602  BP: 119/65  Pulse: 95  Resp: 20  Temp: 97.6 F (36.4 C)  SpO2: 98%
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
## 2023-03-30 ENCOUNTER — Other Ambulatory Visit: Payer: Self-pay

## 2023-03-30 ENCOUNTER — Encounter (HOSPITAL_BASED_OUTPATIENT_CLINIC_OR_DEPARTMENT_OTHER): Payer: Self-pay | Admitting: Otolaryngology

## 2023-03-30 ENCOUNTER — Other Ambulatory Visit: Payer: Self-pay | Admitting: Otolaryngology

## 2023-03-31 ENCOUNTER — Other Ambulatory Visit: Payer: Self-pay

## 2023-03-31 ENCOUNTER — Encounter (HOSPITAL_COMMUNITY): Payer: Self-pay | Admitting: Anesthesiology

## 2023-03-31 ENCOUNTER — Encounter (HOSPITAL_COMMUNITY): Payer: Self-pay | Admitting: Otolaryngology
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
## 2023-04-01 ENCOUNTER — Ambulatory Visit (HOSPITAL_COMMUNITY): Admission: RE | Admit: 2023-04-01 | Payer: MEDICAID | Source: Home / Self Care | Admitting: Otolaryngology

## 2023-04-01 SURGERY — OPEN REDUCTION INTERNAL FIXATION (ORIF) ZYGOMATIC FRACTURE
Anesthesia: General | Laterality: Left
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
## 2023-04-03 ENCOUNTER — Ambulatory Visit (HOSPITAL_BASED_OUTPATIENT_CLINIC_OR_DEPARTMENT_OTHER): Payer: MEDICAID | Admitting: Anesthesiology

## 2023-04-03 ENCOUNTER — Encounter (HOSPITAL_BASED_OUTPATIENT_CLINIC_OR_DEPARTMENT_OTHER): Payer: Self-pay | Admitting: Otolaryngology

## 2023-04-03 ENCOUNTER — Encounter (HOSPITAL_BASED_OUTPATIENT_CLINIC_OR_DEPARTMENT_OTHER)
Admission: RE | Disposition: A | Discharge status: Home / Self Care | Payer: Self-pay | Source: Home / Self Care | Attending: Otolaryngology

## 2023-04-03 ENCOUNTER — Ambulatory Visit (HOSPITAL_BASED_OUTPATIENT_CLINIC_OR_DEPARTMENT_OTHER)
Admission: RE | Admit: 2023-04-03 | Discharge: 2023-04-03 | Disposition: A | Discharge status: Home / Self Care | Payer: MEDICAID | Attending: Otolaryngology | Admitting: Otolaryngology

## 2023-04-03 ENCOUNTER — Other Ambulatory Visit: Payer: Self-pay

## 2023-04-03 DIAGNOSIS — S0240FA Zygomatic fracture, left side, initial encounter for closed fracture: Secondary | ICD-10-CM

## 2023-04-03 DIAGNOSIS — F17210 Nicotine dependence, cigarettes, uncomplicated: Secondary | ICD-10-CM | POA: Diagnosis not present

## 2023-04-03 DIAGNOSIS — J45909 Unspecified asthma, uncomplicated: Secondary | ICD-10-CM | POA: Diagnosis not present

## 2023-04-03 HISTORY — PX: ORIF ZYGOMATIC FRACTURE: SHX2134

## 2023-04-03 SURGERY — OPEN REDUCTION INTERNAL FIXATION (ORIF) ZYGOMATIC FRACTURE
Anesthesia: General | Site: Face | Laterality: Left

## 2023-04-03 MED ORDER — LIDOCAINE 2% (20 MG/ML) 5 ML SYRINGE
INTRAMUSCULAR | Status: DC | PRN
  Administered 2023-04-03: 60 mg via INTRAVENOUS

## 2023-04-03 MED ORDER — PHENYLEPHRINE HCL (PRESSORS) 10 MG/ML IV SOLN
INTRAVENOUS | Status: AC
  Filled 2023-04-03: qty 2

## 2023-04-03 MED ORDER — FENTANYL CITRATE (PF) 100 MCG/2ML IJ SOLN
INTRAMUSCULAR | Status: AC
  Filled 2023-04-03: qty 2

## 2023-04-03 MED ORDER — ONDANSETRON HCL 4 MG/2ML IJ SOLN: 4.0000 mg | Freq: Once | INTRAMUSCULAR | Status: DC | PRN

## 2023-04-03 MED ORDER — GLYCOPYRROLATE 0.2 MG/ML IJ SOLN
INTRAMUSCULAR | Status: DC | PRN
  Administered 2023-04-03: .2 mg via INTRAVENOUS

## 2023-04-03 MED ORDER — SUCCINYLCHOLINE CHLORIDE 200 MG/10ML IV SOSY
PREFILLED_SYRINGE | INTRAVENOUS | Status: AC
  Filled 2023-04-03: qty 10

## 2023-04-03 MED ORDER — 0.9 % SODIUM CHLORIDE (POUR BTL) OPTIME
TOPICAL | Status: DC | PRN
  Administered 2023-04-03: 1000 mL

## 2023-04-03 MED ORDER — MIDAZOLAM HCL 5 MG/5ML IJ SOLN
INTRAMUSCULAR | Status: DC | PRN
  Administered 2023-04-03: 2 mg via INTRAVENOUS

## 2023-04-03 MED ORDER — MIDAZOLAM HCL 2 MG/2ML IJ SOLN
INTRAMUSCULAR | Status: AC
  Filled 2023-04-03: qty 2

## 2023-04-03 MED ORDER — LIDOCAINE-EPINEPHRINE 1 %-1:100000 IJ SOLN
INTRAMUSCULAR | Status: DC | PRN
  Administered 2023-04-03: 2 mL

## 2023-04-03 MED ORDER — CEFAZOLIN SODIUM 1 G IJ SOLR
INTRAMUSCULAR | Status: AC
  Filled 2023-04-03: qty 20

## 2023-04-03 MED ORDER — PROPOFOL 10 MG/ML IV BOLUS
INTRAVENOUS | Status: DC | PRN
Start: 2023-04-03 — End: 2023-04-03
  Administered 2023-04-03: 200 mg via INTRAVENOUS

## 2023-04-03 MED ORDER — OXYCODONE HCL 5 MG PO TABS
ORAL_TABLET | ORAL | Status: AC
  Filled 2023-04-03: qty 1

## 2023-04-03 MED ORDER — CELECOXIB 200 MG PO CAPS
200.0000 mg | ORAL_CAPSULE | Freq: Once | ORAL | Status: AC
  Administered 2023-04-03: 200 mg via ORAL

## 2023-04-03 MED ORDER — CELECOXIB 200 MG PO CAPS
ORAL_CAPSULE | ORAL | Status: AC
  Filled 2023-04-03: qty 1

## 2023-04-03 MED ORDER — LACTATED RINGERS IV SOLN: INTRAVENOUS | Status: DC

## 2023-04-03 MED ORDER — ONDANSETRON HCL 4 MG/2ML IJ SOLN
INTRAMUSCULAR | Status: DC | PRN
  Administered 2023-04-03: 4 mg via INTRAVENOUS

## 2023-04-03 MED ORDER — ACETAMINOPHEN 325 MG PO TABS: 325.0000 mg | ORAL_TABLET | ORAL | Status: DC | PRN

## 2023-04-03 MED ORDER — ACETAMINOPHEN 500 MG PO TABS
1000.0000 mg | ORAL_TABLET | Freq: Once | ORAL | Status: AC
  Administered 2023-04-03: 1000 mg via ORAL

## 2023-04-03 MED ORDER — SUGAMMADEX SODIUM 200 MG/2ML IV SOLN
INTRAVENOUS | Status: DC | PRN
  Administered 2023-04-03: 300 mg via INTRAVENOUS

## 2023-04-03 MED ORDER — ACETAMINOPHEN 500 MG PO TABS
ORAL_TABLET | ORAL | Status: AC
  Filled 2023-04-03: qty 2

## 2023-04-03 MED ORDER — BACITRACIN 500 UNIT/GM EX OINT
TOPICAL_OINTMENT | CUTANEOUS | Status: DC | PRN
  Administered 2023-04-03: 1 via TOPICAL

## 2023-04-03 MED ORDER — MEPERIDINE HCL 25 MG/ML IJ SOLN: 6.2500 mg | INTRAMUSCULAR | Status: DC | PRN

## 2023-04-03 MED ORDER — ROCURONIUM BROMIDE 10 MG/ML (PF) SYRINGE
PREFILLED_SYRINGE | INTRAVENOUS | Status: AC
  Filled 2023-04-03: qty 10

## 2023-04-03 MED ORDER — FENTANYL CITRATE (PF) 100 MCG/2ML IJ SOLN: 25.0000 ug | INTRAMUSCULAR | Status: DC | PRN

## 2023-04-03 MED ORDER — OXYCODONE HCL 5 MG/5ML PO SOLN: 5.0000 mg | Freq: Once | ORAL | Status: AC | PRN

## 2023-04-03 MED ORDER — CEFAZOLIN SODIUM-DEXTROSE 2-3 GM-%(50ML) IV SOLR
INTRAVENOUS | Status: DC | PRN
  Administered 2023-04-03: 2 g via INTRAVENOUS

## 2023-04-03 MED ORDER — FENTANYL CITRATE (PF) 100 MCG/2ML IJ SOLN
INTRAMUSCULAR | Status: DC | PRN
  Administered 2023-04-03: 100 ug via INTRAVENOUS

## 2023-04-03 MED ORDER — SUCCINYLCHOLINE CHLORIDE 200 MG/10ML IV SOSY
PREFILLED_SYRINGE | INTRAVENOUS | Status: DC | PRN
  Administered 2023-04-03: 100 mg via INTRAVENOUS

## 2023-04-03 MED ORDER — OXYCODONE HCL 5 MG PO TABS
5.0000 mg | ORAL_TABLET | Freq: Once | ORAL | Status: AC | PRN
  Administered 2023-04-03: 5 mg via ORAL

## 2023-04-03 MED ORDER — ACETAMINOPHEN 160 MG/5ML PO SOLN: 325.0000 mg | ORAL | Status: DC | PRN

## 2023-04-03 MED ORDER — DEXAMETHASONE SODIUM PHOSPHATE 4 MG/ML IJ SOLN
INTRAMUSCULAR | Status: DC | PRN
  Administered 2023-04-03: 10 mg via INTRAVENOUS

## 2023-04-03 MED ORDER — DEXMEDETOMIDINE HCL IN NACL 80 MCG/20ML IV SOLN
INTRAVENOUS | Status: DC | PRN
  Administered 2023-04-03 (×2): 12 ug via INTRAVENOUS

## 2023-04-03 SURGICAL SUPPLY — 55 items
APL PRP STRL LF DISP 70% ISPRP (MISCELLANEOUS) ×1
APL SRG 3 HI ABS STRL LF PLS (MISCELLANEOUS)
APPLICATOR DR MATTHEWS STRL (MISCELLANEOUS) IMPLANT
BLADE SURG 15 STRL LF DISP TIS (BLADE) ×2 IMPLANT
BLADE SURG 15 STRL SS (BLADE) ×1
CANISTER SUCT 1200ML W/VALVE (MISCELLANEOUS) ×2 IMPLANT
CHLORAPREP W/TINT 26 (MISCELLANEOUS) IMPLANT
CLEANER CAUTERY TIP 5X5 PAD (MISCELLANEOUS) IMPLANT
COVER BACK TABLE 60X90IN (DRAPES) ×2 IMPLANT
COVER MAYO STAND STRL (DRAPES) ×2 IMPLANT
DEPRESSOR TONGUE 6 IN STERILE (GAUZE/BANDAGES/DRESSINGS) IMPLANT
DRAPE LAPAROTOMY 100X72 PEDS (DRAPES) IMPLANT
DRAPE UTILITY XL STRL (DRAPES) IMPLANT
ELECT COATED BLADE 2.86 ST (ELECTRODE) IMPLANT
ELECT NDL BLADE 2-5/6 (NEEDLE) ×2 IMPLANT
ELECT NDL TIP 2.8 STRL (NEEDLE) ×2 IMPLANT
ELECT NEEDLE BLADE 2-5/6 (NEEDLE) ×1 IMPLANT
ELECT NEEDLE TIP 2.8 STRL (NEEDLE) ×1 IMPLANT
ELECT REM PT RETURN 9FT ADLT (ELECTROSURGICAL) ×1
ELECTRODE REM PT RTRN 9FT ADLT (ELECTROSURGICAL) ×2 IMPLANT
GLOVE BIO SURGEON STRL SZ7.5 (GLOVE) ×2 IMPLANT
GOWN STRL REUS W/ TWL LRG LVL3 (GOWN DISPOSABLE) ×4 IMPLANT
GOWN STRL REUS W/TWL LRG LVL3 (GOWN DISPOSABLE) ×2
NDL BLUNT 17GA (NEEDLE) IMPLANT
NDL HYPO 25X1 1.5 SAFETY (NEEDLE) ×2 IMPLANT
NEEDLE BLUNT 17GA (NEEDLE) IMPLANT
NEEDLE HYPO 25X1 1.5 SAFETY (NEEDLE) ×1 IMPLANT
NS IRRIG 1000ML POUR BTL (IV SOLUTION) ×2 IMPLANT
PACK BASIN DAY SURGERY FS (CUSTOM PROCEDURE TRAY) ×2 IMPLANT
PATTIES SURGICAL .5 X3 (DISPOSABLE) IMPLANT
PENCIL SMOKE EVACUATOR (MISCELLANEOUS) ×2 IMPLANT
SCISSORS WIRE ANG 4 3/4 DISP (INSTRUMENTS) IMPLANT
SHEILD EYE MED CORNL SHD 22X21 (OPHTHALMIC RELATED)
SHIELD EYE MED CORNL SHD 22X21 (OPHTHALMIC RELATED) IMPLANT
SLEEVE SCD COMPRESS KNEE MED (STOCKING) ×2 IMPLANT
SPIKE FLUID TRANSFER (MISCELLANEOUS) IMPLANT
STAPLER VISISTAT (STAPLE) IMPLANT
STAPLER VISISTAT 35W (STAPLE) IMPLANT
SUCTION TUBE FRAZIER 10FR DISP (SUCTIONS) IMPLANT
SUT ETHILON 4 0 P 3 18 (SUTURE) IMPLANT
SUT ETHILON 6 0 P 1 (SUTURE) IMPLANT
SUT MNCRL AB 4-0 PS2 18 (SUTURE) IMPLANT
SUT SILK 3 0 REEL (SUTURE) IMPLANT
SUT VIC AB 3-0 FS2 27 (SUTURE) IMPLANT
SUT VIC AB 3-0 SH 27 (SUTURE) ×1
SUT VIC AB 3-0 SH 27X BRD (SUTURE) IMPLANT
SUT VIC AB 4-0 P-3 18XBRD (SUTURE) IMPLANT
SUT VIC AB 4-0 P3 18 (SUTURE)
SUT VICRYL+ 3-0 27IN RB-1 (SUTURE) IMPLANT
SYR BULB EAR ULCER 3OZ GRN STR (SYRINGE) ×2 IMPLANT
SYR CONTROL 10ML LL (SYRINGE) ×2 IMPLANT
TOWEL GREEN STERILE FF (TOWEL DISPOSABLE) ×4 IMPLANT
TRAY DSU PREP LF (CUSTOM PROCEDURE TRAY) IMPLANT
TUBE CONNECTING 20X1/4 (TUBING) ×2 IMPLANT
YANKAUER SUCT BULB TIP NO VENT (SUCTIONS) IMPLANT

## 2023-04-03 NOTE — Discharge Instructions (Signed)

## 2023-04-03 NOTE — Anesthesia Preprocedure Evaluation (Signed)
Anesthesia Evaluation  Patient identified by MRN, date of birth, ID band Patient awake    Reviewed: Allergy & Precautions, H&P , NPO status , Patient's Chart, lab work & pertinent test results  Airway Mallampati: I  TM Distance: >3 FB Neck ROM: Full    Dental no notable dental hx. (+) Teeth Intact, Dental Advisory Given   Pulmonary asthma , Current Smoker   Pulmonary exam normal breath sounds clear to auscultation       Cardiovascular negative cardio ROS  Rhythm:Regular Rate:Normal     Neuro/Psych negative neurological ROS  negative psych ROS   GI/Hepatic negative GI ROS, Neg liver ROS,,,  Endo/Other  negative endocrine ROS    Renal/GU negative Renal ROS  negative genitourinary   Musculoskeletal   Abdominal   Peds  Hematology negative hematology ROS (+)   Anesthesia Other Findings   Reproductive/Obstetrics negative OB ROS                             Anesthesia Physical Anesthesia Plan  ASA: 2  Anesthesia Plan: General   Post-op Pain Management: Tylenol PO (pre-op)* and Celebrex PO (pre-op)*   Induction: Intravenous  PONV Risk Score and Plan: 2 and Ondansetron, Dexamethasone and Treatment may vary due to age or medical condition  Airway Management Planned: Oral ETT  Additional Equipment: None  Intra-op Plan:   Post-operative Plan: Extubation in OR  Informed Consent: I have reviewed the patients History and Physical, chart, labs and discussed the procedure including the risks, benefits and alternatives for the proposed anesthesia with the patient or authorized representative who has indicated his/her understanding and acceptance.     Dental advisory given  Plan Discussed with: CRNA and Anesthesiologist  Anesthesia Plan Comments:         Anesthesia Quick Evaluation

## 2023-04-03 NOTE — Anesthesia Procedure Notes (Signed)
Procedure Name: Intubation Date/Time: 04/03/2023 2:04 PM  Performed by: Montez Morita,  W, CRNAPre-anesthesia Checklist: Patient identified, Emergency Drugs available, Suction available and Patient being monitored Patient Re-evaluated:Patient Re-evaluated prior to induction Oxygen Delivery Method: Circle system utilized Preoxygenation: Pre-oxygenation with 100% oxygen Induction Type: IV induction Ventilation: Mask ventilation without difficulty Laryngoscope Size: Miller and 2 Grade View: Grade I Tube type: Oral Tube size: 7.5 mm Number of attempts: 1 Airway Equipment and Method: Stylet Placement Confirmation: ETT inserted through vocal cords under direct vision, positive ETCO2 and breath sounds checked- equal and bilateral Secured at: 23 cm Tube secured with: Tape Dental Injury: Teeth and Oropharynx as per pre-operative assessment

## 2023-04-03 NOTE — Op Note (Signed)
Preop diagnosis: Left depressed zygomatic arch fracture Postop diagnosis: same Procedure: Open reduction of left zygomatic arch fracture via Gilley's approach Surgeon: Jenne Pane Assist: None Anesth: General and local with 1% lidocaine with 1:100,000 epinephrine Compl: None Findings: Depressed left zygomatic arch fracture reduced.  Description:  After discussing risks, benefits, and alternatives, the patient was brought to the operative suite and placed on the operative table in the supine position.  Anesthesia was induced and the patient was intubated by the Anesthesia team without difficulty.  The eyes were taped and the face was prepped and draped in sterile fashion.  Intravenous antibiotic was given.  The left temporal incision was marked and injected with local anesthetic.  The incision was made with a 15 blade and extended through the subcutaneous tissue using electrocautery.  The superficial temporal artery was ligated.  Dissection continued down to the deep temporalis fascia that was then incised.  An elevator was inserted deep to that layer and passed deep to the zygomatic arch.  The elevator was then used to reduce the arch into place.  After completion, the wound was copiously irrigated with saline.  The subcutaneous layer was closed with 3-0 Vicryl and the skin was closed with staples.  Drapes were removed and the patient was cleaned off.  Bacitracin ointment was added to the incision.  The patient was returned to anesthesia for wake-up, was extubated, and moved to the recovery room in stable condition.

## 2023-04-03 NOTE — Brief Op Note (Signed)
04/03/2023  2:28 PM  PATIENT:  David Spears  32 y.o. male  PRE-OPERATIVE DIAGNOSIS:  FRACTURE LEFT ZYGOMATIC ARCH  POST-OPERATIVE DIAGNOSIS:  FRACTURE LEFT ZYGOMATIC ARCH  PROCEDURE:  Procedure(s): OPEN REDUCTION INTERNAL FIXATION (ORIF) ZYGOMATIC FRACTURE LEFT (Left)  SURGEON:  Surgeons and Role:    Christia Reading, MD - Primary  PHYSICIAN ASSISTANT:   ASSISTANTS: none   ANESTHESIA:   general  EBL:  Minimal   BLOOD ADMINISTERED:none  DRAINS: none   LOCAL MEDICATIONS USED:  LIDOCAINE   SPECIMEN:  No Specimen  DISPOSITION OF SPECIMEN:  N/A  COUNTS:  YES  TOURNIQUET:  * No tourniquets in log *  DICTATION: .Note written in EPIC  PLAN OF CARE: Discharge to home after PACU  PATIENT DISPOSITION:  PACU - hemodynamically stable.   Delay start of Pharmacological VTE agent (>24hrs) due to surgical blood loss or risk of bleeding: no
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
## 2023-04-03 NOTE — Transfer of Care (Signed)
Immediate Anesthesia Transfer of Care Note  Patient: David Spears  Procedure(s) Performed: OPEN REDUCTION INTERNAL FIXATION (ORIF) ZYGOMATIC FRACTURE LEFT (Left: Face)  Patient Location: PACU  Anesthesia Type:General  Level of Consciousness: awake  Airway & Oxygen Therapy: Patient Spontanous Breathing and Patient connected to face mask oxygen  Post-op Assessment: Report given to RN and Post -op Vital signs reviewed and stable  Post vital signs: Reviewed and stable  Last Vitals:  Vitals Value Taken Time  BP 112/95 04/03/23 1438  Temp    Pulse 70 04/03/23 1440  Resp 17 04/03/23 1440  SpO2 95 % 04/03/23 1440  Vitals shown include unfiled device data.  Last Pain:  Vitals:   04/03/23 1333  TempSrc: Oral  PainSc: 6       Patients Stated Pain Goal: 3 (04/03/23 1333)  Complications: No notable events documented.

## 2023-04-04 NOTE — Anesthesia Postprocedure Evaluation (Signed)
Anesthesia Post Note  Patient: David Spears  Procedure(s) Performed: OPEN REDUCTION INTERNAL FIXATION (ORIF) ZYGOMATIC FRACTURE LEFT (Left: Face)     Patient location during evaluation: PACU Anesthesia Type: General Level of consciousness: awake and alert Pain management: pain level controlled Vital Signs Assessment: post-procedure vital signs reviewed and stable Respiratory status: spontaneous breathing, nonlabored ventilation, respiratory function stable and patient connected to nasal cannula oxygen Cardiovascular status: blood pressure returned to baseline and stable Postop Assessment: no apparent nausea or vomiting Anesthetic complications: no   No notable events documented.  Last Vitals:  Vitals:   04/03/23 1510 04/03/23 1520  BP:  (!) 105/59  Pulse: 61 (!) 56  Resp: 19 19  Temp:  36.7 C  SpO2: 97% 100%    Last Pain:  Vitals:   04/03/23 1520  TempSrc:   PainSc: 3                  ,

## 2023-04-06 ENCOUNTER — Encounter (HOSPITAL_BASED_OUTPATIENT_CLINIC_OR_DEPARTMENT_OTHER): Payer: Self-pay | Admitting: Otolaryngology

## 2024-01-12 ENCOUNTER — Encounter (INDEPENDENT_AMBULATORY_CARE_PROVIDER_SITE_OTHER): Payer: Self-pay
